# Patient Record
Sex: Female | Born: 1975 | Race: White | Hispanic: No | Marital: Single | Smoking: Current every day smoker
Health system: Southern US, Community
[De-identification: ages and names within clinical notes are randomized; demographics above are authoritative.]

## PROBLEM LIST (undated history)

## (undated) DIAGNOSIS — C801 Malignant (primary) neoplasm, unspecified: Secondary | ICD-10-CM

## (undated) DIAGNOSIS — G43909 Migraine, unspecified, not intractable, without status migrainosus: Secondary | ICD-10-CM

## (undated) DIAGNOSIS — B1910 Unspecified viral hepatitis B without hepatic coma: Secondary | ICD-10-CM

## (undated) DIAGNOSIS — J45909 Unspecified asthma, uncomplicated: Secondary | ICD-10-CM

## (undated) DIAGNOSIS — R569 Unspecified convulsions: Secondary | ICD-10-CM

## (undated) HISTORY — DX: Unspecified convulsions: R56.9

## (undated) HISTORY — DX: Unspecified viral hepatitis B without hepatic coma: B19.10

## (undated) HISTORY — PX: TUBAL LIGATION: SHX77

## (undated) HISTORY — DX: Malignant (primary) neoplasm, unspecified: C80.1

## (undated) HISTORY — PX: CHOLECYSTECTOMY: SHX55

## (undated) HISTORY — PX: DILATION AND CURETTAGE OF UTERUS: SHX78

## (undated) HISTORY — DX: Migraine, unspecified, not intractable, without status migrainosus: G43.909

## (undated) HISTORY — PX: HERNIA REPAIR: SHX51

---

## 2001-05-04 ENCOUNTER — Emergency Department (HOSPITAL_COMMUNITY): Admission: EM | Admit: 2001-05-04 | Discharge: 2001-05-04 | Payer: Self-pay | Admitting: Emergency Medicine

## 2001-05-04 ENCOUNTER — Encounter: Payer: Self-pay | Admitting: Emergency Medicine

## 2003-12-18 ENCOUNTER — Inpatient Hospital Stay: Payer: Self-pay | Admitting: Psychiatry

## 2004-03-18 ENCOUNTER — Emergency Department: Payer: Self-pay | Admitting: Emergency Medicine

## 2005-09-30 ENCOUNTER — Emergency Department: Payer: Self-pay | Admitting: Unknown Physician Specialty

## 2007-01-09 ENCOUNTER — Emergency Department: Payer: Self-pay | Admitting: Emergency Medicine

## 2007-06-10 ENCOUNTER — Emergency Department: Payer: Self-pay | Admitting: Internal Medicine

## 2007-12-31 ENCOUNTER — Emergency Department: Payer: Self-pay | Admitting: Emergency Medicine

## 2008-06-30 ENCOUNTER — Emergency Department: Payer: Self-pay | Admitting: Emergency Medicine

## 2008-07-14 ENCOUNTER — Emergency Department: Payer: Self-pay | Admitting: Emergency Medicine

## 2008-10-03 ENCOUNTER — Inpatient Hospital Stay: Payer: Self-pay | Admitting: Specialist

## 2008-11-17 ENCOUNTER — Inpatient Hospital Stay: Payer: Self-pay | Admitting: Internal Medicine

## 2009-01-17 ENCOUNTER — Emergency Department: Payer: Self-pay | Admitting: Emergency Medicine

## 2009-02-23 ENCOUNTER — Emergency Department: Payer: Self-pay | Admitting: Emergency Medicine

## 2009-04-22 ENCOUNTER — Emergency Department: Payer: Self-pay | Admitting: Emergency Medicine

## 2009-05-23 ENCOUNTER — Emergency Department: Payer: Self-pay

## 2009-10-11 ENCOUNTER — Emergency Department: Payer: Self-pay | Admitting: Internal Medicine

## 2010-01-25 ENCOUNTER — Emergency Department: Payer: Self-pay | Admitting: Unknown Physician Specialty

## 2010-05-18 ENCOUNTER — Emergency Department: Payer: Self-pay | Admitting: Emergency Medicine

## 2011-07-27 ENCOUNTER — Emergency Department: Payer: Self-pay | Admitting: Emergency Medicine

## 2011-07-27 LAB — URINALYSIS, COMPLETE
Bacteria: NONE SEEN
Bilirubin,UR: NEGATIVE
Blood: NEGATIVE
Glucose,UR: NEGATIVE mg/dL (ref 0–75)
Ketone: NEGATIVE
Leukocyte Esterase: NEGATIVE
Nitrite: NEGATIVE
Ph: 5 (ref 4.5–8.0)
Protein: 30
RBC,UR: NONE SEEN /HPF (ref 0–5)
Specific Gravity: 1.027 (ref 1.003–1.030)
Squamous Epithelial: 13
WBC UR: NONE SEEN /HPF (ref 0–5)

## 2011-07-27 LAB — COMPREHENSIVE METABOLIC PANEL
Calcium, Total: 9.3 mg/dL (ref 8.5–10.1)
Chloride: 103 mmol/L (ref 98–107)
Co2: 28 mmol/L (ref 21–32)
Creatinine: 0.95 mg/dL (ref 0.60–1.30)
Glucose: 98 mg/dL (ref 65–99)
Osmolality: 279 (ref 275–301)
SGOT(AST): 23 U/L (ref 15–37)
SGPT (ALT): 13 U/L
Sodium: 140 mmol/L (ref 136–145)
Total Protein: 8.1 g/dL (ref 6.4–8.2)

## 2011-07-27 LAB — CBC
HCT: 38.4 % (ref 35.0–47.0)
HGB: 12.8 g/dL (ref 12.0–16.0)
MCH: 26.4 pg (ref 26.0–34.0)
MCV: 79 fL — ABNORMAL LOW (ref 80–100)
Platelet: 311 10*3/uL (ref 150–440)
RBC: 4.86 10*6/uL (ref 3.80–5.20)
RDW: 18.6 % — ABNORMAL HIGH (ref 11.5–14.5)
WBC: 11.3 10*3/uL — ABNORMAL HIGH (ref 3.6–11.0)

## 2011-07-27 LAB — LIPASE, BLOOD: Lipase: 68 U/L — ABNORMAL LOW (ref 73–393)

## 2011-09-12 ENCOUNTER — Emergency Department: Payer: Self-pay | Admitting: Emergency Medicine

## 2011-09-12 LAB — DRUG SCREEN, URINE
Amphetamines, Ur Screen: NEGATIVE (ref ?–1000)
Barbiturates, Ur Screen: NEGATIVE (ref ?–200)
Benzodiazepine, Ur Scrn: NEGATIVE (ref ?–200)
Cannabinoid 50 Ng, Ur ~~LOC~~: POSITIVE (ref ?–50)
Cocaine Metabolite,Ur ~~LOC~~: POSITIVE (ref ?–300)
MDMA (Ecstasy)Ur Screen: NEGATIVE (ref ?–500)
Methadone, Ur Screen: NEGATIVE (ref ?–300)
Opiate, Ur Screen: NEGATIVE (ref ?–300)
Phencyclidine (PCP) Ur S: NEGATIVE (ref ?–25)
Tricyclic, Ur Screen: NEGATIVE (ref ?–1000)

## 2011-09-12 LAB — CBC
HCT: 36.3 % (ref 35.0–47.0)
HGB: 11.6 g/dL — ABNORMAL LOW (ref 12.0–16.0)
MCH: 25.4 pg — ABNORMAL LOW (ref 26.0–34.0)
MCHC: 32 g/dL (ref 32.0–36.0)
MCV: 80 fL (ref 80–100)
Platelet: 219 10*3/uL (ref 150–440)
RBC: 4.57 10*6/uL (ref 3.80–5.20)
RDW: 17.7 % — ABNORMAL HIGH (ref 11.5–14.5)
WBC: 17.7 10*3/uL — ABNORMAL HIGH (ref 3.6–11.0)

## 2011-09-12 LAB — URINALYSIS, COMPLETE
Bilirubin,UR: NEGATIVE
Glucose,UR: NEGATIVE mg/dL (ref 0–75)
Hyaline Cast: 11
Leukocyte Esterase: NEGATIVE
Nitrite: NEGATIVE
Ph: 5 (ref 4.5–8.0)
Protein: NEGATIVE
RBC,UR: 11 /HPF (ref 0–5)
Specific Gravity: 1.028 (ref 1.003–1.030)
Squamous Epithelial: 5
WBC UR: 1 /HPF (ref 0–5)

## 2011-09-12 LAB — COMPREHENSIVE METABOLIC PANEL
Albumin: 4.6 g/dL (ref 3.4–5.0)
Alkaline Phosphatase: 64 U/L (ref 50–136)
Anion Gap: 10 (ref 7–16)
BUN: 27 mg/dL — ABNORMAL HIGH (ref 7–18)
Bilirubin,Total: 0.4 mg/dL (ref 0.2–1.0)
Calcium, Total: 9 mg/dL (ref 8.5–10.1)
Chloride: 106 mmol/L (ref 98–107)
Co2: 26 mmol/L (ref 21–32)
Creatinine: 0.95 mg/dL (ref 0.60–1.30)
EGFR (African American): >60
EGFR (Non-African Amer.): >60
Glucose: 94 mg/dL (ref 65–99)
Osmolality: 288 (ref 275–301)
Potassium: 3.5 mmol/L (ref 3.5–5.1)
SGOT(AST): 31 U/L (ref 15–37)
SGPT (ALT): 26 U/L
Sodium: 142 mmol/L (ref 136–145)
Total Protein: 7.5 g/dL (ref 6.4–8.2)

## 2011-09-12 LAB — ACETAMINOPHEN LEVEL: Acetaminophen: <2 ug/mL

## 2011-09-12 LAB — PREGNANCY, URINE: Pregnancy Test, Urine: NEGATIVE m[IU]/mL

## 2011-09-12 LAB — ETHANOL
Ethanol %: <0.003 % (ref 0.000–0.080)
Ethanol: <3 mg/dL

## 2011-09-12 LAB — SALICYLATE LEVEL: Salicylates, Serum: 1.8 mg/dL

## 2012-07-31 ENCOUNTER — Emergency Department: Payer: Self-pay | Admitting: Emergency Medicine

## 2012-07-31 LAB — URINALYSIS, COMPLETE
Bilirubin,UR: NEGATIVE
Glucose,UR: NEGATIVE mg/dL (ref 0–75)
Ketone: NEGATIVE
Nitrite: NEGATIVE
Ph: 5 (ref 4.5–8.0)
Protein: NEGATIVE
Specific Gravity: 1.019 (ref 1.003–1.030)
Squamous Epithelial: 9

## 2013-10-30 ENCOUNTER — Emergency Department: Payer: Self-pay | Admitting: Emergency Medicine

## 2013-10-30 LAB — CBC WITH DIFFERENTIAL/PLATELET
Basophil #: 0.1 10*3/uL (ref 0.0–0.1)
Basophil %: 1.2 %
EOS ABS: 0.1 10*3/uL (ref 0.0–0.7)
Eosinophil %: 0.8 %
HCT: 38.5 % (ref 35.0–47.0)
HGB: 12 g/dL (ref 12.0–16.0)
Lymphocyte #: 2.1 10*3/uL (ref 1.0–3.6)
Lymphocyte %: 21.2 %
MCH: 25.5 pg — ABNORMAL LOW (ref 26.0–34.0)
MCHC: 31.3 g/dL — AB (ref 32.0–36.0)
MCV: 81 fL (ref 80–100)
Monocyte #: 0.7 x10 3/mm (ref 0.2–0.9)
Monocyte %: 7 %
NEUTROS PCT: 69.8 %
Neutrophil #: 6.7 10*3/uL — ABNORMAL HIGH (ref 1.4–6.5)
PLATELETS: 314 10*3/uL (ref 150–440)
RBC: 4.72 10*6/uL (ref 3.80–5.20)
RDW: 18.7 % — ABNORMAL HIGH (ref 11.5–14.5)
WBC: 9.7 10*3/uL (ref 3.6–11.0)

## 2013-10-30 LAB — COMPREHENSIVE METABOLIC PANEL
AST: 30 U/L (ref 15–37)
Albumin: 4 g/dL (ref 3.4–5.0)
Alkaline Phosphatase: 60 U/L
Anion Gap: 6 — ABNORMAL LOW (ref 7–16)
BILIRUBIN TOTAL: 0.4 mg/dL (ref 0.2–1.0)
BUN: 12 mg/dL (ref 7–18)
CHLORIDE: 105 mmol/L (ref 98–107)
CREATININE: 0.96 mg/dL (ref 0.60–1.30)
Calcium, Total: 8.5 mg/dL (ref 8.5–10.1)
Co2: 27 mmol/L (ref 21–32)
Glucose: 98 mg/dL (ref 65–99)
Osmolality: 275 (ref 275–301)
POTASSIUM: 3.6 mmol/L (ref 3.5–5.1)
SGPT (ALT): 19 U/L
SODIUM: 138 mmol/L (ref 136–145)
Total Protein: 7.2 g/dL (ref 6.4–8.2)

## 2013-10-30 LAB — URINALYSIS, COMPLETE
BACTERIA: NONE SEEN
BILIRUBIN, UR: NEGATIVE
BLOOD: NEGATIVE
Glucose,UR: NEGATIVE mg/dL (ref 0–75)
Ketone: NEGATIVE
Leukocyte Esterase: NEGATIVE
Nitrite: NEGATIVE
Ph: 6 (ref 4.5–8.0)
Protein: 30
Specific Gravity: 1.023 (ref 1.003–1.030)
Squamous Epithelial: 27

## 2013-10-30 LAB — LIPASE, BLOOD: LIPASE: 85 U/L (ref 73–393)

## 2013-10-30 LAB — CLOSTRIDIUM DIFFICILE(ARMC)

## 2014-06-14 ENCOUNTER — Emergency Department
Admit: 2014-06-14 | Disposition: A | Discharge status: Home / Self Care | Payer: Self-pay | Admitting: Emergency Medicine

## 2014-07-12 ENCOUNTER — Other Ambulatory Visit: Payer: Self-pay | Admitting: Internal Medicine

## 2014-07-12 ENCOUNTER — Ambulatory Visit: Payer: Self-pay | Admitting: Internal Medicine

## 2014-07-12 DIAGNOSIS — R1013 Epigastric pain: Secondary | ICD-10-CM | POA: Insufficient documentation

## 2014-07-12 DIAGNOSIS — R011 Cardiac murmur, unspecified: Secondary | ICD-10-CM

## 2014-07-12 DIAGNOSIS — Z720 Tobacco use: Secondary | ICD-10-CM | POA: Insufficient documentation

## 2014-07-12 DIAGNOSIS — R079 Chest pain, unspecified: Secondary | ICD-10-CM

## 2014-07-12 LAB — CBC AND DIFFERENTIAL
Neutrophils Absolute: 6 /uL
WBC: 8.1 10^3/mL

## 2014-07-12 LAB — LIPID PANEL
CHOLESTEROL: 178 mg/dL (ref 0–200)
HDL: 100 mg/dL — AB (ref 35–70)
LDL Cholesterol: 51 mg/dL
TRIGLYCERIDES: 134 mg/dL (ref 40–160)

## 2014-07-12 LAB — BASIC METABOLIC PANEL
BUN: 13 mg/dL (ref 4–21)
CREATININE: 0.8 mg/dL (ref 0.5–1.1)
Glucose: 95 mg/dL
SODIUM: 138 mmol/L (ref 137–147)

## 2014-07-12 LAB — HEPATIC FUNCTION PANEL: Bilirubin, Total: 0.3 mg/dL

## 2014-07-12 LAB — TSH: TSH: 1.23 u[IU]/mL (ref 0.41–5.90)

## 2014-07-14 ENCOUNTER — Ambulatory Visit
Admission: RE | Admit: 2014-07-14 | Discharge: 2014-07-14 | Disposition: A | Discharge status: Home / Self Care | Payer: Self-pay | Source: Ambulatory Visit | Attending: Internal Medicine | Admitting: Internal Medicine

## 2014-07-14 ENCOUNTER — Other Ambulatory Visit: Payer: Self-pay | Admitting: Internal Medicine

## 2014-07-14 DIAGNOSIS — F17200 Nicotine dependence, unspecified, uncomplicated: Secondary | ICD-10-CM | POA: Insufficient documentation

## 2014-07-14 DIAGNOSIS — R079 Chest pain, unspecified: Secondary | ICD-10-CM

## 2014-07-14 DIAGNOSIS — R011 Cardiac murmur, unspecified: Secondary | ICD-10-CM

## 2014-07-14 NOTE — Progress Notes (Signed)
Echocardiogram 2D Echocardiogram has been performed.  [name] 07/14/2014, 12:10 PM

## 2014-07-26 ENCOUNTER — Ambulatory Visit: Payer: Self-pay | Admitting: Internal Medicine

## 2014-07-26 DIAGNOSIS — R011 Cardiac murmur, unspecified: Secondary | ICD-10-CM | POA: Insufficient documentation

## 2014-07-31 ENCOUNTER — Emergency Department
Admission: EM | Admit: 2014-07-31 | Discharge: 2014-07-31 | Disposition: A | Discharge status: Home / Self Care | Payer: Self-pay | Attending: Emergency Medicine | Admitting: Emergency Medicine

## 2014-07-31 ENCOUNTER — Encounter: Payer: Self-pay | Admitting: *Deleted

## 2014-07-31 DIAGNOSIS — Z720 Tobacco use: Secondary | ICD-10-CM | POA: Insufficient documentation

## 2014-07-31 DIAGNOSIS — L259 Unspecified contact dermatitis, unspecified cause: Secondary | ICD-10-CM | POA: Insufficient documentation

## 2014-07-31 MED ORDER — PREDNISONE 10 MG PO TABS: ORAL_TABLET | ORAL | Status: DC

## 2014-07-31 MED ORDER — PREDNISONE 20 MG PO TABS
60.0000 mg | ORAL_TABLET | Freq: Once | ORAL | Status: AC
  Administered 2014-07-31: 60 mg via ORAL

## 2014-07-31 MED ORDER — DIPHENHYDRAMINE HCL 25 MG PO CAPS: 25.0000 mg | ORAL_CAPSULE | Freq: Three times a day (TID) | ORAL | Status: DC | PRN

## 2014-07-31 NOTE — ED Provider Notes (Signed)
[facility] Emergency Department Provider Note  ____________________________________________  Time seen: Approximately 11:05 AM  I have reviewed the triage vital signs and the nursing notes.   HISTORY  Chief Complaint Poison Ivy   HPI Abigail Martinez is a 39 y.o. female presents to the ER for complaints of bilateral arm and neck rash. 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 Patient states that shortly after she had itching to her bilateral arms. Patient states that this morning she woke up in her sleep rubbing her nose.  States rash is itchy.DENIES facial swelling, difficulty swallowing or eating. Reports continues to eat and drink well. Denies any pain. Reports rash is itching.Denies vision changes. States unrelieved with OTC anti itch and calamine lotion    History reviewed. No pertinent past medical history.  There are no active problems to display for this patient.   History reviewed. No pertinent past surgical history.  No current outpatient prescriptions on file.  Allergies Review of patient's allergies indicates no known allergies.  No family history on file.  Social History History  Substance Use Topics  . Smoking status: Current Every Day Smoker  . Smokeless tobacco: Not on file  . Alcohol Use: No    Review of Systems Constitutional: No fever/chills Eyes: No visual changes. ENT: No sore throat. Cardiovascular: Denies chest pain. Respiratory: Denies shortness of breath. Gastrointestinal: No abdominal pain.  No nausea, no vomiting.  No diarrhea.  No constipation. Genitourinary: Negative for dysuria. Musculoskeletal: Negative for back pain. Skin: positive for rash. Neurological: Negative for headaches, focal weakness or numbness.  10-point ROS otherwise  negative.  ____________________________________________   PHYSICAL EXAM:  VITAL SIGNS: ED Triage Vitals  Enc Vitals Group     BP 07/31/14 1059 135/1 mmHg     Pulse Rate 07/31/14 1059 81     Resp 07/31/14 1059 20     Temp 07/31/14 1059 98.7 F (37.1 C)     Temp Source 07/31/14 1059 Oral     SpO2 07/31/14 1059 99 %     Weight 07/31/14 1059 101 lb (45.813 kg)     Height 07/31/14 1059 5\' 1"  (1.549 m)     Head Cir --      Peak Flow --      Pain Score --      Pain Loc --      Pain Edu? --      Excl. in [place]? --    Blood pressure 141/96, pulse 80, temperature 98.4 F (36.9 C), temperature source Oral, resp. rate 20, height 5\' 1"  (1.549 m), weight 101 lb (45.813 kg), last menstrual period 07/17/2014, SpO2 99 %.  Constitutional: Alert and oriented. Well appearing and in no acute distress. Eyes: Conjunctivae are normal. PERRL. EOMI. Head: Atraumatic.no facial swelling Nose: No [employer].at erythema distal nose. Skin intact. No rash. No swelling or drainage. Nares patent. Mouth/Throat: Mucous membranes are moist.  Oropharynx non-erythematous. Neck: No stridor.  No cervical spine tenderness to palpation. Hematological/Lymphatic/Immunilogical: No cervical lymphadenopathy. Cardiovascular: Normal rate, regular rhythm. Grossly normal heart sounds.  Good peripheral circulation. Respiratory: Normal respiratory effort.  No retractions. Lungs CTAB. Gastrointestinal: Soft and nontender. No distention. No abdominal bruits. No CVA tenderness. Musculoskeletal: No lower extremity tenderness nor edema.  No joint effusions. Neurologic:  Normal speech and language. No gross focal neurologic deficits are appreciated. Speech is normal.  No gait instability. Skin:  Skin is warm, dry and intact.  Bilateral forearms and right neck with area of clustered vesicular and pruritic rash. No erythema, no induration, no fluctuance.  Psychiatric: Mood and affect are normal. Speech and behavior are  normal.  _______________________________________   INITIAL IMPRESSION / ASSESSMENT AND PLAN / ED COURSE  Pertinent labs & imaging results that were available during my care of the patient were reviewed by me and considered in my medical decision making (see chart for details).  Well-appearing patient. Presents for the ER for 2 day history of arm and right neck facial rash. Concern for exposure to poison oak or poison ivy unrelieved by over-the-counter medications. Will treat with taper prednisone and Benadryl. Follow up primary care physician. Return to the ER for new or worsening concerns. ____________________________________________   FINAL CLINICAL IMPRESSION(S) / ED DIAGNOSES  Final diagnoses:  Contact dermatitis      Marylene Land, NP 07/31/14 [place], NP 07/31/14 1144  Lavonia Drafts, MD 07/31/14 [phone]

## 2014-07-31 NOTE — Discharge Instructions (Signed)
Contact Dermatitis Contact dermatitis is a rash that happens when something touches the skin. You touched something that irritates your skin, or you have allergies to something you touched. HOME CARE   Avoid the thing that caused your rash.  Keep your rash away from hot water, soap, sunlight, chemicals, and other things that might bother it.  Do not scratch your rash.  You can take cool baths to help stop itching.  Only take medicine as told by your doctor.  Keep all doctor visits as told. GET HELP RIGHT AWAY IF:   Your rash is not better after 3 days.  Your rash gets worse.  Your rash is puffy (swollen), tender, red, sore, or warm.  You have problems with your medicine. MAKE SURE YOU:   Understand these instructions.  Will watch your condition.  Will get help right away if you are not doing well or get worse. Document Released: 12/08/2008 Document Revised: 05/05/2011 Document Reviewed: 07/16/2010 [facility] Patient Information 2015 [place], [place]. This information is not intended to replace advice given to you by your health care provider. Make sure you discuss any questions you have with your health care provider.  Poison [employer] ivy is a inflammation of the skin (contact dermatitis) caused by touching the allergens on the leaves of the ivy plant following previous exposure to the plant. The rash usually appears 48 hours after exposure. The rash is usually bumps (papules) or blisters (vesicles) in a linear pattern. Depending on your own sensitivity, the rash may simply cause redness and itching, or it may also progress to blisters which may break open. These must be well cared for to prevent secondary bacterial (germ) infection, followed by scarring. Keep any open areas dry, clean, dressed, and covered with an antibacterial ointment if needed. The eyes may also get puffy. The puffiness is worst in the morning and gets better as the day progresses. This dermatitis usually heals  without scarring, within 2 to 3 weeks without treatment. HOME CARE INSTRUCTIONS  Thoroughly wash with soap and water as soon as you have been exposed to poison ivy. You have about one half hour to remove the plant resin before it will cause the rash. This washing will destroy the oil or antigen on the skin that is causing, or will cause, the rash. Be sure to wash under your fingernails as any plant resin there will continue to spread the rash. Do not rub skin vigorously when washing affected area. Poison ivy cannot spread if no oil from the plant remains on your body. A rash that has progressed to weeping sores will not spread the rash unless you have not washed thoroughly. It is also important to wash any clothes you have been wearing as these may carry active allergens. The rash will return if you wear the unwashed clothing, even several days later. Avoidance of the plant in the future is the best measure. Poison ivy plant can be recognized by the number of leaves. Generally, poison ivy has three leaves with flowering branches on a single stem. Diphenhydramine may be purchased over the counter and used as needed for itching. Do not drive with this medication if it makes you drowsy.Ask your caregiver about medication for children. SEEK MEDICAL CARE IF:  Open sores develop.  Redness spreads beyond area of rash.  You notice purulent (pus-like) discharge.  You have increased pain.  Other signs of infection develop (such as fever). Document Released: 02/08/2000 Document Revised: 05/05/2011 Document Reviewed: 07/21/2008 [employer] Patient Information  2015 [employer]. This information is not intended to replace advice given to you by your health care provider. Make sure you discuss any questions you have with your health care provider.

## 2014-10-15 ENCOUNTER — Encounter: Payer: Self-pay | Admitting: Emergency Medicine

## 2014-10-15 ENCOUNTER — Emergency Department
Admission: EM | Admit: 2014-10-15 | Discharge: 2014-10-15 | Disposition: A | Discharge status: Home / Self Care | Payer: Self-pay | Attending: Emergency Medicine | Admitting: Emergency Medicine

## 2014-10-15 DIAGNOSIS — Y998 Other external cause status: Secondary | ICD-10-CM | POA: Insufficient documentation

## 2014-10-15 DIAGNOSIS — Z720 Tobacco use: Secondary | ICD-10-CM | POA: Insufficient documentation

## 2014-10-15 DIAGNOSIS — W57XXXA Bitten or stung by nonvenomous insect and other nonvenomous arthropods, initial encounter: Secondary | ICD-10-CM | POA: Insufficient documentation

## 2014-10-15 DIAGNOSIS — Y9289 Other specified places as the place of occurrence of the external cause: Secondary | ICD-10-CM | POA: Insufficient documentation

## 2014-10-15 DIAGNOSIS — S80262A Insect bite (nonvenomous), left knee, initial encounter: Secondary | ICD-10-CM | POA: Insufficient documentation

## 2014-10-15 DIAGNOSIS — Y9389 Activity, other specified: Secondary | ICD-10-CM | POA: Insufficient documentation

## 2014-10-15 NOTE — Discharge Instructions (Signed)
Spider Bite Most spider bites do not cause serious problems. HOME CARE  Do not scratch the bite.  Keep the bite clean and dry. Wash the bite with soap and water as told by your doctor.  Put ice on the bite.  Put ice in a plastic bag.  Place a towel between your skin and the bag.  Leave the ice on for 20 minutes. Do this 4 times a day for the first 2 to 3 days or as told by your doctor.  Raise (elevate) the bite above your heart.  Only take medicine as told by your doctor.  If you are given medicines (antibiotics), take them as told. Finish them even if you start to feel better. You may need a tetanus shot if:  You cannot remember when you had your last tetanus shot.  You have never had a tetanus shot.  The bite broke your skin. If you need a tetanus shot and you choose not to have one, you may get tetanus. Sickness from tetanus can be serious. GET HELP RIGHT AWAY IF:  Your bite turns purple.  Your bite gets more puffy (swollen), painful, or red.  You are short of breath or have chest pain.  You have muscle cramps or painful muscle spasms.  You have belly (abdominal) pain.  You feel sick to your stomach (nauseous) or throw up (vomit).  You feel very tired or sleepy.  Your bite is not better after 3 days of treatment. MAKE SURE YOU:  Understand these instructions.  Will watch your condition.  Will get help right away if you are not doing well or get worse. Document Released: 03/15/2010 Document Revised: 05/05/2011 Document Reviewed: 09/11/2010 [facility] Patient Information 2015 [place], [place]. This information is not intended to replace advice given to you by your health care provider. Make sure you discuss any questions you have with your health care provider.  Keep the wound clean, dry, and covered.  Apply hydrocortisone cream for itch and irritation.

## 2014-10-15 NOTE — ED Provider Notes (Signed)
[facility] Emergency Department Provider Note ____________________________________________  Time seen: 1530  I have reviewed the triage vital signs and the nursing notes.  HISTORY  Chief Complaint  Insect Bite  HPI Abigail Martinez is a 39 y.o. female reports to the ED for evaluation and management of a suspect a spider bite to the back of the left knee yesterday.She reports the bite occurred while she was in the bed and she felt something bite her from under the sheets. She later believes she found the dead spider in the sheets. She reports some itching and irritation to the small bite, but denies any drainage, fevers, chills, or sweats.  History reviewed. No pertinent past medical history.  There are no active problems to display for this patient.  Past Surgical History  Procedure Laterality Date  . Cholecystectomy    . Hernia repair    . Tubal ligation      Current Outpatient Rx  Name  Route  Sig  Dispense  Refill  . diphenhydrAMINE (BENADRYL) 25 mg capsule   Oral   Take 1 capsule (25 mg total) by mouth every 8 (eight) hours as needed.   15 capsule   0   . predniSONE (DELTASONE) 10 MG tablet      Start 60 mg po day one, then 50 mg po day two, taper by 10 mg daily until complete.   21 tablet   0    Allergies Benadryl and Strawberry  No family history on file.  Social History Social History  Substance Use Topics  . Smoking status: Current Every Day Smoker    Types: Cigarettes  . Smokeless tobacco: Never Used  . Alcohol Use: 1.2 oz/week    2 Cans of beer per week   Review of Systems  Constitutional: Negative for fever. Eyes: Negative for visual changes. ENT: Negative for sore throat. Cardiovascular: Negative for chest pain. Respiratory: Negative for shortness of breath. Gastrointestinal: Negative for abdominal pain, vomiting and diarrhea. Genitourinary: Negative for dysuria. Musculoskeletal: Negative for back pain. Skin: Negative  for rash. Insect bite as above Neurological: Negative for headaches, focal weakness or numbness. ____________________________________________  PHYSICAL EXAM:  VITAL SIGNS: ED Triage Vitals  Enc Vitals Group     BP 10/15/14 1358 130/86 mmHg     Pulse Rate 10/15/14 1358 93     Resp 10/15/14 1358 18     Temp 10/15/14 1358 98 F (36.7 C)     Temp Source 10/15/14 1358 Oral     SpO2 10/15/14 1358 97 %     Weight 10/15/14 1358 114 lb (51.71 kg)     Height 10/15/14 1358 5\' 1"  (1.549 m)     Head Cir --      Peak Flow --      Pain Score 10/15/14 1400 6     Pain Loc --      Pain Edu? --      Excl. in [place]? --    Constitutional: Alert and oriented. Well appearing and in no distress. Eyes: Conjunctivae are normal. PERRL. Normal extraocular movements. ENT   Head: Normocephalic and atraumatic.   Nose: No congestion/rhinnorhea.   Mouth/Throat: Mucous membranes are moist.   Neck: Supple. No thyromegaly. Hematological/Lymphatic/Immunilogical: No cervical lymphadenopathy. Cardiovascular: Normal distal pulses Respiratory: Normal respiratory effort.  Musculoskeletal: Nontender with normal range of motion in all extremities.  Neurologic:  Normal gait without ataxia. Normal speech and language. No gross focal neurologic deficits are appreciated. Skin:  Skin is warm, dry  and intact. No rash noted. Local area of erythema and induration to the back of the left knee. No fluctuance, warmth, lymphangitis, or drainage. Psychiatric: Mood and affect are normal. Patient exhibits appropriate insight and judgment. ____________________________________________  PROCEDURES  Dry dressing applied ____________________________________________  INITIAL IMPRESSION / ASSESSMENT AND PLAN / ED COURSE  Local reaction to spider bite. No signs of infection, cellulitis, or abscess. ____________________________________________  FINAL CLINICAL IMPRESSION(S) / ED DIAGNOSES  Final diagnoses:  Insect bite      Melvenia Needles, PA-C 10/15/14 1543  Lisa Roca, MD 10/18/14 1210

## 2014-10-15 NOTE — ED Notes (Signed)
AAOx3.  Skin warm and dry.  NAD.  D/C home 

## 2014-10-15 NOTE — ED Notes (Signed)
AAOx3.  Skin warm and dry.  NAD.  Ambulates with easy and steady gait.  D/C home 

## 2014-10-15 NOTE — ED Notes (Addendum)
Pt reports spider bite behind left knee yesterday Red swollen area noted. Pt reports feeling light headed and nauseated. Spider with pt in Pill bottle.

## 2014-12-19 ENCOUNTER — Emergency Department
Admission: EM | Admit: 2014-12-19 | Discharge: 2014-12-19 | Disposition: A | Discharge status: Home / Self Care | Payer: Self-pay | Attending: Student | Admitting: Student

## 2014-12-19 ENCOUNTER — Encounter: Payer: Self-pay | Admitting: Emergency Medicine

## 2014-12-19 DIAGNOSIS — H10023 Other mucopurulent conjunctivitis, bilateral: Secondary | ICD-10-CM

## 2014-12-19 DIAGNOSIS — Z7952 Long term (current) use of systemic steroids: Secondary | ICD-10-CM | POA: Insufficient documentation

## 2014-12-19 DIAGNOSIS — H109 Unspecified conjunctivitis: Secondary | ICD-10-CM | POA: Insufficient documentation

## 2014-12-19 DIAGNOSIS — R51 Headache: Secondary | ICD-10-CM | POA: Insufficient documentation

## 2014-12-19 DIAGNOSIS — Z720 Tobacco use: Secondary | ICD-10-CM | POA: Insufficient documentation

## 2014-12-19 HISTORY — DX: Unspecified asthma, uncomplicated: J45.909

## 2014-12-19 MED ORDER — TRAMADOL HCL 50 MG PO TABS
50.0000 mg | ORAL_TABLET | Freq: Four times a day (QID) | ORAL | Status: DC | PRN
Start: 2014-12-19 — End: 2016-06-29

## 2014-12-19 MED ORDER — GENTAMICIN SULFATE 0.3 % OP SOLN: 1.0000 [drp] | Freq: Four times a day (QID) | OPHTHALMIC | Status: DC

## 2014-12-19 NOTE — ED Notes (Signed)
Pt complains of bilateral eye pain with facial pain. Pus pocket is noted in right lower lid. Pt denies any blurred vision.

## 2014-12-19 NOTE — Discharge Instructions (Signed)
Bacterial Conjunctivitis °Bacterial conjunctivitis, commonly called pink eye, is an inflammation of the clear membrane that covers the white part of the eye (conjunctiva). The inflammation can also happen on the underside of the eyelids. The blood vessels in the conjunctiva become inflamed, causing the eye to become red or pink. Bacterial conjunctivitis may spread easily from one eye to another and from person to person (contagious).  °CAUSES  °Bacterial conjunctivitis is caused by bacteria. The bacteria may come from your own skin, your upper respiratory tract, or from someone else with bacterial conjunctivitis. °SYMPTOMS  °The normally white color of the eye or the underside of the eyelid is usually pink or red. The pink eye is usually associated with irritation, tearing, and some sensitivity to light. Bacterial conjunctivitis is often associated with a thick, yellowish discharge from the eye. The discharge may turn into a crust on the eyelids overnight, which causes your eyelids to stick together. If a discharge is present, there may also be some blurred vision in the affected eye. °DIAGNOSIS  °Bacterial conjunctivitis is diagnosed by your caregiver through an eye exam and the symptoms that you report. Your caregiver looks for changes in the surface tissues of your eyes, which may point to the specific type of conjunctivitis. A sample of any discharge may be collected on a cotton-tip swab if you have a severe case of conjunctivitis, if your cornea is affected, or if you keep getting repeat infections that do not respond to treatment. The sample will be sent to a lab to see if the inflammation is caused by a bacterial infection and to see if the infection will respond to antibiotic medicines. °TREATMENT  °1. Bacterial conjunctivitis is treated with antibiotics. Antibiotic eyedrops are most often used. However, antibiotic ointments are also available. Antibiotics pills are sometimes used. Artificial tears or eye  washes may ease discomfort. °HOME CARE INSTRUCTIONS  °1. To ease discomfort, apply a cool, clean washcloth to your eye for 10-20 minutes, 3-4 times a day. °2. Gently wipe away any drainage from your eye with a warm, wet washcloth or a cotton ball. °3. Wash your hands often with soap and water. Use paper towels to dry your hands. °4. Do not share towels or washcloths. This may spread the infection. °5. Change or wash your pillowcase every day. °6. You should not use eye makeup until the infection is gone. °7. Do not operate machinery or drive if your vision is blurred. °8. Stop using contact lenses. Ask your caregiver how to sterilize or replace your contacts before using them again. This depends on the type of contact lenses that you use. °9. When applying medicine to the infected eye, do not touch the edge of your eyelid with the eyedrop bottle or ointment tube. °SEEK IMMEDIATE MEDICAL CARE IF:  °· Your infection has not improved within 3 days after beginning treatment. °· You had yellow discharge from your eye and it returns. °· You have increased eye pain. °· Your eye redness is spreading. °· Your vision becomes blurred. °· You have a fever or persistent symptoms for more than 2-3 days. °· You have a fever and your symptoms suddenly get worse. °· You have facial pain, redness, or swelling. °MAKE SURE YOU:  °· Understand these instructions. °· Will watch your condition. °· Will get help right away if you are not doing well or get worse. °  °This information is not intended to replace advice given to you by your health care provider. Make sure you   discuss any questions you have with your health care provider. °  °Document Released: 02/10/2005 Document Revised: 03/03/2014 Document Reviewed: 07/14/2011 °Elsevier Interactive Patient Education ©2016 [employer] ° °How to Use Eye Drops and Eye Ointments °HOW TO APPLY EYE DROPS °Follow these steps when applying eye drops: °2. Wash your hands. °3. Tilt your head  back. °4. Put a finger under your eye and use it to gently pull your lower lid downward. Keep that finger in place. °5. Using your other hand, hold the dropper between your thumb and index finger. °6. Position the dropper just over the edge of the lower lid. Hold it as close to your eye as you can without touching the dropper to your eye. °7. Steady your hand. One way to do this is to lean your index finger against your brow. °8. Look up. °9. Slowly and gently squeeze one drop of medicine into your eye. °10. Close your eye. °11. Place a finger between your lower eyelid and your nose. Press gently for 2 minutes. This increases the amount of time that the medicine is exposed to the eye. It also reduces side effects that can develop if the drop gets into the bloodstream through the nose. °HOW TO APPLY EYE OINTMENTS °Follow these steps when applying eye ointments: °10. Wash your hands. °11. Put a finger under your eye and use it to gently pull your lower lid downward. Keep that finger in place. °12. Using your other hand, place the tip of the tube between your thumb and index finger with the remaining fingers braced against your cheek or nose. °13. Hold the tube just over the edge of your lower lid without touching the tube to your lid or eyeball. °14. Look up. °15. Line the inner part of your lower lid with ointment. °16. Gently pull up on your upper lid and look down. This will force the ointment to spread over the surface of the eye. °17. Release the upper lid. °18. If you can, close your eyes for 1-2 minutes. °Do not rub your eyes. If you applied the ointment correctly, your vision will be blurry for a few minutes. This is normal. °ADDITIONAL INFORMATION °· Make sure to use the eye drops or ointment as told by your health care provider. °· If you have been told to use both eye drops and an eye ointment, apply the eye drops first, then wait 3-4 minutes before you apply the ointment. °· Try not to touch the tip of the  dropper or tube to your eye. A dropper or tube that has touched the eye can become contaminated. °  °This information is not intended to replace advice given to you by your health care provider. Make sure you discuss any questions you have with your health care provider. °  °Document Released: 05/19/2000 Document Revised: 06/27/2014 Document Reviewed: 02/06/2014 °Elsevier Interactive Patient Education ©2016 [employer] ° °

## 2014-12-19 NOTE — ED Provider Notes (Signed)
[facility] Emergency Department Provider Note  ____________________________________________  Time seen: Approximately 12:36 PM  I have reviewed the triage vital signs and the nursing notes.   HISTORY  Chief Complaint Eye Pain    HPI Abigail Martinez is a 39 y.o. female patient complaining of bilateral eye pain restraints. Patient states left teye was initially affected. Patient denies contact usage.Patient denies any URI signs symptoms. Patient also denies any fever. Patient states she has a headache associated with this complaint. Patient rates her overall pain discomfort as a 6/10. No palliative measures taken for this complaint.   Past Medical History  Diagnosis Date  . Asthma     There are no active problems to display for this patient.   Past Surgical History  Procedure Laterality Date  . Cholecystectomy    . Hernia repair    . Tubal ligation      Current Outpatient Rx  Name  Route  Sig  Dispense  Refill  . diphenhydrAMINE (BENADRYL) 25 mg capsule   Oral   Take 1 capsule (25 mg total) by mouth every 8 (eight) hours as needed.   15 capsule   0   . predniSONE (DELTASONE) 10 MG tablet      Start 60 mg po day one, then 50 mg po day two, taper by 10 mg daily until complete.   21 tablet   0     Allergies Strawberry extract  No family history on file.  Social History Social History  Substance Use Topics  . Smoking status: Current Every Day Smoker    Types: Cigarettes  . Smokeless tobacco: Never Used  . Alcohol Use: 1.2 oz/week    2 Cans of beer per week    Review of Systems Constitutional: No fever/chills Eyes: No visual changes. CBC currently results ENT: No sore throat. Cardiovascular: Denies chest pain. Respiratory: Denies shortness of breath. Gastrointestinal: No abdominal pain.  No nausea, no vomiting.  No diarrhea.  No constipation. Genitourinary: Negative for dysuria. Musculoskeletal: Negative for back pain. Skin:  Negative for rash. Neurological: Negative for headaches, focal weakness or numbness. Allergic/Immunilogical: Strawberry abstract  10-point ROS otherwise negative.  ____________________________________________   PHYSICAL EXAM:  VITAL SIGNS: ED Triage Vitals  Enc Vitals Group     BP 12/19/14 1157 151/96 mmHg     Pulse Rate 12/19/14 1157 83     Resp 12/19/14 1157 16     Temp 12/19/14 1157 98.1 F (36.7 C)     Temp Source 12/19/14 1157 Oral     SpO2 12/19/14 1157 98 %     Weight 12/19/14 1157 113 lb (51.256 kg)     Height 12/19/14 1157 5\' 1"  (1.549 m)     Head Cir --      Peak Flow --      Pain Score 12/19/14 1158 6     Pain Loc --      Pain Edu? --      Excl. in [place]? --     Constitutional: Alert and oriented. Well appearing and in no acute distress. Eyes: Conjunctivae are erythematous. PERRL. EOMI. PERRL in bilateral discharge Head: Atraumatic. Nose: No congestion/rhinnorhea. Mouth/Throat: Mucous membranes are moist.  Oropharynx non-erythematous. Neck: No stridor. No cervical spine tenderness to palpation. Hematological/Lymphatic/Immunilogical: No cervical lymphadenopathy. Cardiovascular: Normal rate, regular rhythm. Grossly normal heart sounds.  Good peripheral circulation. Respiratory: Normal respiratory effort.  No retractions. Lungs CTAB. Gastrointestinal: Soft and nontender. No distention. No abdominal bruits. No CVA tenderness. Musculoskeletal: No  lower extremity tenderness nor edema.  No joint effusions. Neurologic:  Normal speech and language. No gross focal neurologic deficits are appreciated. No gait instability. Skin:  Skin is warm, dry and intact. No rash noted. Psychiatric: Mood and affect are normal. Speech and behavior are normal.  ____________________________________________   LABS (all labs ordered are listed, but only abnormal results are displayed)  Labs Reviewed - No data to  display ____________________________________________  EKG   ____________________________________________  RADIOLOGY   ____________________________________________   PROCEDURES  Procedure(s) performed: None  Critical Care performed: No  ____________________________________________   INITIAL IMPRESSION / ASSESSMENT AND PLAN / ED COURSE  Pertinent labs & imaging results that were available during my care of the patient were reviewed by me and considered in my medical decision making (see chart for details).  Bacterial conjunctivitis bilaterally. Patient given prescription for gentamicin eyedrops. Patient given discharge instruction 104 course contamination. Patient advised to [facility] condition [facility]. ____________________________________________   FINAL CLINICAL IMPRESSION(S) / ED DIAGNOSES  Final diagnoses:  Acute bacterial conjunctivitis, bilateral      Abigail MICHELLI, PA-C 12/19/14 [place] Gayle, MD 12/19/14 [phone]

## 2015-01-01 ENCOUNTER — Other Ambulatory Visit: Payer: Self-pay

## 2015-01-01 ENCOUNTER — Encounter: Payer: Self-pay | Admitting: Emergency Medicine

## 2015-01-01 ENCOUNTER — Emergency Department
Admission: EM | Admit: 2015-01-01 | Discharge: 2015-01-01 | Disposition: A | Discharge status: Home / Self Care | Payer: Self-pay | Attending: Emergency Medicine | Admitting: Emergency Medicine

## 2015-01-01 DIAGNOSIS — R197 Diarrhea, unspecified: Secondary | ICD-10-CM | POA: Insufficient documentation

## 2015-01-01 DIAGNOSIS — Z3202 Encounter for pregnancy test, result negative: Secondary | ICD-10-CM | POA: Insufficient documentation

## 2015-01-01 DIAGNOSIS — K529 Noninfective gastroenteritis and colitis, unspecified: Secondary | ICD-10-CM

## 2015-01-01 DIAGNOSIS — Z720 Tobacco use: Secondary | ICD-10-CM | POA: Insufficient documentation

## 2015-01-01 DIAGNOSIS — R1013 Epigastric pain: Secondary | ICD-10-CM | POA: Insufficient documentation

## 2015-01-01 DIAGNOSIS — R140 Abdominal distension (gaseous): Secondary | ICD-10-CM | POA: Insufficient documentation

## 2015-01-01 DIAGNOSIS — Z79899 Other long term (current) drug therapy: Secondary | ICD-10-CM | POA: Insufficient documentation

## 2015-01-01 LAB — CBC
HCT: 40.2 % (ref 35.0–47.0)
HEMOGLOBIN: 13.4 g/dL (ref 12.0–16.0)
MCH: 31.8 pg (ref 26.0–34.0)
MCHC: 33.5 g/dL (ref 32.0–36.0)
MCV: 95.1 fL (ref 80.0–100.0)
PLATELETS: 226 10*3/uL (ref 150–440)
RBC: 4.22 MIL/uL (ref 3.80–5.20)
RDW: 18.3 % — ABNORMAL HIGH (ref 11.5–14.5)
WBC: 8 10*3/uL (ref 3.6–11.0)

## 2015-01-01 LAB — URINALYSIS COMPLETE WITH MICROSCOPIC (ARMC ONLY)
BILIRUBIN URINE: NEGATIVE
Bacteria, UA: NONE SEEN
Glucose, UA: NEGATIVE mg/dL
Hgb urine dipstick: NEGATIVE
KETONES UR: NEGATIVE mg/dL
Leukocytes, UA: NEGATIVE
NITRITE: NEGATIVE
Protein, ur: NEGATIVE mg/dL
SPECIFIC GRAVITY, URINE: 1.019 (ref 1.005–1.030)
WBC UA: NONE SEEN WBC/hpf (ref 0–5)
pH: 7 (ref 5.0–8.0)

## 2015-01-01 LAB — COMPREHENSIVE METABOLIC PANEL
ALK PHOS: 57 U/L (ref 38–126)
ALT: 24 U/L (ref 14–54)
ANION GAP: 8 (ref 5–15)
AST: 54 U/L — ABNORMAL HIGH (ref 15–41)
Albumin: 4.2 g/dL (ref 3.5–5.0)
BILIRUBIN TOTAL: 0.6 mg/dL (ref 0.3–1.2)
BUN: 9 mg/dL (ref 6–20)
CALCIUM: 9.3 mg/dL (ref 8.9–10.3)
CO2: 28 mmol/L (ref 22–32)
CREATININE: 0.66 mg/dL (ref 0.44–1.00)
Chloride: 105 mmol/L (ref 101–111)
GFR calc non Af Amer: >60 mL/min (ref >60)
Glucose, Bld: 115 mg/dL — ABNORMAL HIGH (ref 65–99)
Potassium: 3.9 mmol/L (ref 3.5–5.1)
SODIUM: 141 mmol/L (ref 135–145)
TOTAL PROTEIN: 6.9 g/dL (ref 6.5–8.1)

## 2015-01-01 LAB — POCT PREGNANCY, URINE: Preg Test, Ur: NEGATIVE

## 2015-01-01 LAB — LIPASE, BLOOD: Lipase: 25 U/L (ref 11–51)

## 2015-01-01 MED ORDER — SIMETHICONE 80 MG PO CHEW: 80.0000 mg | CHEWABLE_TABLET | Freq: Four times a day (QID) | ORAL | Status: DC | PRN

## 2015-01-01 MED ORDER — DICYCLOMINE HCL 20 MG PO TABS: 20.0000 mg | ORAL_TABLET | Freq: Four times a day (QID) | ORAL | Status: DC

## 2015-01-01 NOTE — ED Notes (Signed)
Pt to ed with c/o increased bloating over the last 3 weeks.  Pt states bloating is worse after eating.  Pt states "I feel like I am about to pop"

## 2015-01-01 NOTE — ED Notes (Signed)
Pt states abd swelling and bloating for 3 weeks, states at night the swelling gets worse, last BM 11/7, states she feels pressure on her hips and ribs

## 2015-01-01 NOTE — Discharge Instructions (Signed)
Please make an appointment to establish a primary care physician for follow-up. Next  Please return to the emergency department if you develop severe pain, inability to keep down fluids, lightheadedness or fainting, fever, or any other symptoms concerning to you.  Abdominal Pain, Adult Many things can cause abdominal pain. Usually, abdominal pain is not caused by a disease and will improve without treatment. It can often be observed and treated at home. Your health care provider will do a physical exam and possibly order blood tests and X-rays to help determine the seriousness of your pain. However, in many cases, more time must pass before a clear cause of the pain can be found. Before that point, your health care provider may not know if you need more testing or further treatment. HOME CARE INSTRUCTIONS Monitor your abdominal pain for any changes. The following actions may help to alleviate any discomfort you are experiencing:  Only take over-the-counter or prescription medicines as directed by your health care provider.  Do not take laxatives unless directed to do so by your health care provider.  Try a clear liquid diet (broth, tea, or water) as directed by your health care provider. Slowly move to a bland diet as tolerated. SEEK MEDICAL CARE IF:  You have unexplained abdominal pain.  You have abdominal pain associated with nausea or diarrhea.  You have pain when you urinate or have a bowel movement.  You experience abdominal pain that wakes you in the night.  You have abdominal pain that is worsened or improved by eating food.  You have abdominal pain that is worsened with eating fatty foods.  You have a fever. SEEK IMMEDIATE MEDICAL CARE IF:  Your pain does not go away within 2 hours.  You keep throwing up (vomiting).  Your pain is felt only in portions of the abdomen, such as the right side or the left lower portion of the abdomen.  You pass bloody or black tarry  stools. MAKE SURE YOU:  Understand these instructions.  Will watch your condition.  Will get help right away if you are not doing well or get worse.   This information is not intended to replace advice given to you by your health care provider. Make sure you discuss any questions you have with your health care provider.   Document Released: 11/20/2004 Document Revised: 11/01/2014 Document Reviewed: 10/20/2012 Elsevier Interactive Patient Education [employer].

## 2015-01-01 NOTE — ED Provider Notes (Signed)
[facility] Emergency Department Provider Note  ____________________________________________  Time seen: Approximately 3:07 PM  I have reviewed the triage vital signs and the nursing notes.   HISTORY  Chief Complaint Abdominal Pain    HPI Abigail Martinez is a 39 y.o. female s/p cholecystectomy remotely presenting with 3 weeks of abdominal pain and bloating. Patient reports that each day, sometimes for most of the day, she develops abdominal bloating with associated "tight" feeling which is diffuse but worse in the epigastrium. This is worse with eating. It does not change with lying down. She does not have associated burping or an acidic taste in her mouth. It does not wake her up at night. Today she had a normal bowel movement, but does report chronic diarrhea that is unchanged. She denies any fever, nausea, vomiting. She denies any urinary symptoms or change in vaginal discharge. She has not been seen by her primary care physician due to insurance issues. No recent changes in her diet; "I try to eat good."   Past Medical History  Diagnosis Date  . Asthma     There are no active problems to display for this patient.   Past Surgical History  Procedure Laterality Date  . Cholecystectomy    . Hernia repair    . Tubal ligation      Current Outpatient Rx  Name  Route  Sig  Dispense  Refill  . dicyclomine (BENTYL) 20 MG tablet   Oral   Take 1 tablet (20 mg total) by mouth 4 (four) times daily.   28 tablet   0   . diphenhydrAMINE (BENADRYL) 25 mg capsule   Oral   Take 1 capsule (25 mg total) by mouth every 8 (eight) hours as needed.   15 capsule   0   . gentamicin (GARAMYCIN) 0.3 % ophthalmic solution   Both Eyes   Place 1 drop into both eyes 4 (four) times daily.   5 mL   0   . predniSONE (DELTASONE) 10 MG tablet      Start 60 mg po day one, then 50 mg po day two, taper by 10 mg daily until complete.   21 tablet   0   . simethicone (GAS-X)  80 MG chewable tablet   Oral   Chew 1 tablet (80 mg total) by mouth 4 (four) times daily as needed for flatulence.   20 tablet   0   . traMADol (ULTRAM) 50 MG tablet   Oral   Take 1 tablet (50 mg total) by mouth every 6 (six) hours as needed for moderate pain.   12 tablet   0     Allergies Strawberry extract  History reviewed. No pertinent family history.  Social History Social History  Substance Use Topics  . Smoking status: Current Every Day Smoker    Types: Cigarettes  . Smokeless tobacco: Never Used  . Alcohol Use: 1.2 oz/week    2 Cans of beer per week    Review of Systems Constitutional: No fever/chills. No lightheadedness or syncope. Eyes: No visual changes. ENT: No sore throat. Cardiovascular: Denies chest pain, palpitations. Respiratory: Denies shortness of breath.  No cough. Gastrointestinal: Positive epigastric abdominal pain.  No nausea, no vomiting.  Positive chronic diarrhea.  No constipation. Genitourinary: Negative for dysuria. Negative for vaginal discharge. Musculoskeletal: Negative for back pain. Skin: Negative for rash. Neurological: Negative for headaches, focal weakness or numbness.  10-point ROS otherwise negative.  ____________________________________________   PHYSICAL EXAM:  VITAL  SIGNS: ED Triage Vitals  Enc Vitals Group     BP 01/01/15 1143 132/83 mmHg     Pulse Rate 01/01/15 1143 87     Resp 01/01/15 1143 20     Temp 01/01/15 1143 98.3 F (36.8 C)     Temp Source 01/01/15 1143 Oral     SpO2 01/01/15 1143 97 %     Weight 01/01/15 1143 115 lb (52.164 kg)     Height 01/01/15 1143 5\' 1"  (1.549 m)     Head Cir --      Peak Flow --      Pain Score 01/01/15 1143 4     Pain Loc --      Pain Edu? --      Excl. in [place]? --     Constitutional: Alert and oriented. Well appearing and in no acute distress. Answers questions appropriately. Eyes: Conjunctivae are normal.  EOMI. Head: Atraumatic. Nose: No  congestion/rhinnorhea. Mouth/Throat: Mucous membranes are moist.  Neck: No stridor.  Supple.   Cardiovascular: Normal rate, regular rhythm. No murmurs, rubs or gallops.  Respiratory: Normal respiratory effort.  No retractions. Lungs CTAB.  No wheezes, rales or ronchi. Gastrointestinal: Abdomen is soft and mildly distended without fluid wave. She has some mild diffuse tenderness to palpation without focality. She has no Murphy sign. Musculoskeletal: No LE edema.  Neurologic:  Normal speech and language. No gross focal neurologic deficits are appreciated.  Skin:  Skin is warm, dry and intact. No rash noted. Psychiatric: Mood and affect are normal. Speech and behavior are normal.  Normal judgement.  ____________________________________________   LABS (all labs ordered are listed, but only abnormal results are displayed)  Labs Reviewed  COMPREHENSIVE METABOLIC PANEL - Abnormal; Notable for the following:    Glucose, Bld 115 (*)    AST 54 (*)    All other components within normal limits  CBC - Abnormal; Notable for the following:    RDW 18.3 (*)    All other components within normal limits  URINALYSIS COMPLETEWITH MICROSCOPIC ([facility] ONLY) - Abnormal; Notable for the following:    Color, Urine YELLOW (*)    APPearance HAZY (*)    Squamous Epithelial / LPF 6-30 (*)    All other components within normal limits  LIPASE, BLOOD  POCT PREGNANCY, URINE   ____________________________________________  EKG  ED ECG REPORT I, Eula Listen, the attending physician, personally viewed and interpreted this ECG.   Date: 01/01/2015  EKG Time: 1151  Rate: 81  Rhythm: normal sinus rhythm  Axis: Normal  Intervals:none  ST&T Change: Nonspecific T-wave inversion in V1. Incomplete right bundle-branch block. No evidence of ischemia.  ____________________________________________  RADIOLOGY  No results found.  ____________________________________________   PROCEDURES  Procedure(s)  performed: None  Critical Care performed: No ____________________________________________   INITIAL IMPRESSION / ASSESSMENT AND PLAN / ED COURSE  Pertinent labs & imaging results that were available during my care of the patient were reviewed by me and considered in my medical decision making (see chart for details).  39 y.o. female with 3 weeks of abdominal pain and bloating. Her vital signs are stable and on my exam she has a nonfocal exam. Her exam is not consistent with an acute surgical pathology such as appendicitis or obstruction. It is possible that she may have some IBS, or gas. I will start her on simethicone and then tell, and have her follow up with her primary care physician. She understands return precautions and follow-up instructions.  ____________________________________________  FINAL CLINICAL IMPRESSION(S) / ED DIAGNOSES  Final diagnoses:  Epigastric abdominal pain  Abdominal bloating  Chronic diarrhea      NEW MEDICATIONS STARTED DURING THIS VISIT:  New Prescriptions   DICYCLOMINE (BENTYL) 20 MG TABLET    Take 1 tablet (20 mg total) by mouth 4 (four) times daily.   SIMETHICONE (GAS-X) 80 MG CHEWABLE TABLET    Chew 1 tablet (80 mg total) by mouth 4 (four) times daily as needed for flatulence.     Eula Listen, MD 01/01/15 1512

## 2015-02-01 ENCOUNTER — Encounter: Payer: Self-pay | Admitting: Emergency Medicine

## 2015-02-01 ENCOUNTER — Emergency Department
Admission: EM | Admit: 2015-02-01 | Discharge: 2015-02-01 | Disposition: A | Discharge status: Home / Self Care | Payer: Self-pay | Attending: Emergency Medicine | Admitting: Emergency Medicine

## 2015-02-01 DIAGNOSIS — R05 Cough: Secondary | ICD-10-CM

## 2015-02-01 DIAGNOSIS — J45909 Unspecified asthma, uncomplicated: Secondary | ICD-10-CM | POA: Insufficient documentation

## 2015-02-01 DIAGNOSIS — R059 Cough, unspecified: Secondary | ICD-10-CM

## 2015-02-01 DIAGNOSIS — J40 Bronchitis, not specified as acute or chronic: Secondary | ICD-10-CM

## 2015-02-01 DIAGNOSIS — Z79899 Other long term (current) drug therapy: Secondary | ICD-10-CM | POA: Insufficient documentation

## 2015-02-01 DIAGNOSIS — Z792 Long term (current) use of antibiotics: Secondary | ICD-10-CM | POA: Insufficient documentation

## 2015-02-01 DIAGNOSIS — F17210 Nicotine dependence, cigarettes, uncomplicated: Secondary | ICD-10-CM | POA: Insufficient documentation

## 2015-02-01 MED ORDER — PREDNISONE 20 MG PO TABS: ORAL_TABLET | ORAL | Status: DC

## 2015-02-01 MED ORDER — HYDROCOD POLST-CPM POLST ER 10-8 MG/5ML PO SUER: 5.0000 mL | Freq: Two times a day (BID) | ORAL | Status: DC

## 2015-02-01 MED ORDER — HYDROCOD POLST-CPM POLST ER 10-8 MG/5ML PO SUER
5.0000 mL | Freq: Once | ORAL | Status: AC
  Administered 2015-02-01: 5 mL via ORAL
  Filled 2015-02-01: qty 5

## 2015-02-01 MED ORDER — PREDNISONE 20 MG PO TABS
60.0000 mg | ORAL_TABLET | Freq: Once | ORAL | Status: AC
  Administered 2015-02-01: 60 mg via ORAL
  Filled 2015-02-01: qty 3

## 2015-02-01 NOTE — ED Notes (Signed)
Pt dc home ambulatory pain unchanged instructed on follow up plan and med use PT NAD AT DC 

## 2015-02-01 NOTE — ED Provider Notes (Signed)
[facility] Emergency Department Provider Note  ____________________________________________  Time seen: Approximately 4:35 AM  I have reviewed the triage vital signs and the nursing notes.   HISTORY  Chief Complaint Cough    HPI DELVINA Martinez is a 39 y.o. female who presents to the ED from home with a chief complaint of cough. Patient has a history of asthma and notes nonproductive cough 2-3 nights. + Sick contacts. Presents this morning secondary to cough causing her inability to rest. Denies associated fever, chills, chest pain, wheezing, shortness of breath, abdominal pain, nausea, vomiting, diarrhea. Denies recent travel or trauma. Nothing makes her cough better or worse.   Past Medical History  Diagnosis Date  . Asthma     There are no active problems to display for this patient.   Past Surgical History  Procedure Laterality Date  . Cholecystectomy    . Hernia repair    . Tubal ligation      Current Outpatient Rx  Name  Route  Sig  Dispense  Refill  . dicyclomine (BENTYL) 20 MG tablet   Oral   Take 1 tablet (20 mg total) by mouth 4 (four) times daily.   28 tablet   0   . diphenhydrAMINE (BENADRYL) 25 mg capsule   Oral   Take 1 capsule (25 mg total) by mouth every 8 (eight) hours as needed.   15 capsule   0   . gentamicin (GARAMYCIN) 0.3 % ophthalmic solution   Both Eyes   Place 1 drop into both eyes 4 (four) times daily.   5 mL   0   . predniSONE (DELTASONE) 10 MG tablet      Start 60 mg po day one, then 50 mg po day two, taper by 10 mg daily until complete.   21 tablet   0   . simethicone (GAS-X) 80 MG chewable tablet   Oral   Chew 1 tablet (80 mg total) by mouth 4 (four) times daily as needed for flatulence.   20 tablet   0   . traMADol (ULTRAM) 50 MG tablet   Oral   Take 1 tablet (50 mg total) by mouth every 6 (six) hours as needed for moderate pain.   12 tablet   0     Allergies Strawberry extract  No  family history on file.  Social History Social History  Substance Use Topics  . Smoking status: Current Every Day Smoker -- 0.50 packs/day    Types: Cigarettes  . Smokeless tobacco: Never Used  . Alcohol Use: 1.2 oz/week    2 Cans of beer per week    Review of Systems Constitutional: No fever/chills Eyes: No visual changes. ENT: No sore throat. Cardiovascular: Denies chest pain. Respiratory: Positive for nonproductive cough. Denies shortness of breath. Gastrointestinal: No abdominal pain.  No nausea, no vomiting.  No diarrhea.  No constipation. Genitourinary: Negative for dysuria. Musculoskeletal: Negative for back pain. Skin: Negative for rash. Neurological: Negative for headaches, focal weakness or numbness.  10-point ROS otherwise negative.  ____________________________________________   PHYSICAL EXAM:  VITAL SIGNS: ED Triage Vitals  Enc Vitals Group     BP 02/01/15 0332 132/100 mmHg     Pulse Rate 02/01/15 0332 88     Resp 02/01/15 0332 20     Temp 02/01/15 0332 98.2 F (36.8 C)     Temp Source 02/01/15 0332 Oral     SpO2 02/01/15 0332 97 %     Weight 02/01/15 0332 118  lb (53.524 kg)     Height 02/01/15 0332 5\' 1"  (1.549 m)     Head Cir --      Peak Flow --      Pain Score --      Pain Loc --      Pain Edu? --      Excl. in [place]? --     Constitutional: Alert and oriented. Well appearing and in no acute distress. Eyes: Conjunctivae are normal. PERRL. EOMI. Head: Atraumatic. Nose: No congestion/rhinnorhea. Mouth/Throat: Mucous membranes are moist.  Oropharynx non-erythematous. Hoarse voice. Neck: No stridor.   Hematological/Lymphatic/Immunilogical: No cervical lymphadenopathy. Cardiovascular: Normal rate, regular rhythm. Grossly normal heart sounds.  Good peripheral circulation. Respiratory: Normal respiratory effort.  No retractions. Lungs slightly diminished at both bases but otherwise CTAB. Loose, rattling nonproductive cough noted. Gastrointestinal:  Soft and nontender. No distention. No abdominal bruits. No CVA tenderness. Musculoskeletal: No lower extremity tenderness nor edema.  No joint effusions. Neurologic:  Normal speech and language. No gross focal neurologic deficits are appreciated. No gait instability. Skin:  Skin is warm, dry and intact. No rash noted. Psychiatric: Mood and affect are normal. Speech and behavior are normal.  ____________________________________________   LABS (all labs ordered are listed, but only abnormal results are displayed)  Labs Reviewed - No data to display ____________________________________________  EKG  None ____________________________________________  RADIOLOGY  None ____________________________________________   PROCEDURES  Procedure(s) performed: None  Critical Care performed: No  ____________________________________________   INITIAL IMPRESSION / ASSESSMENT AND PLAN / ED COURSE  Pertinent labs & imaging results that were available during my care of the patient were reviewed by me and considered in my medical decision making (see chart for details).  39 year old female with a history of asthma who presents with nonproductive cough and bronchitis. Will initiate prednisone, Tussionex and follow-up with her PCP. Strict return precautions given. Patient verbalizes understanding and agrees with plan of care. ____________________________________________   FINAL CLINICAL IMPRESSION(S) / ED DIAGNOSES  Final diagnoses:  Bronchitis  Cough      Paulette Blanch, MD 02/01/15 [phone]

## 2015-02-01 NOTE — ED Notes (Signed)
Patient ambulatory to triage with steady gait, without difficulty or distress noted; pt reports last few nights having nonprod cough; +smoker , hx asthma

## 2015-02-01 NOTE — Discharge Instructions (Signed)
1. Take steroid as prescribed (prednisone 60 mg daily 4 days). 2. You may take cough medicine as needed (Tussionex). 3. Return to the ER for worsening symptoms, persistent vomiting, difficulty breathing or other concerns.  Cough, Adult Coughing is a reflex that clears your throat and your airways. Coughing helps to heal and protect your lungs. It is normal to cough occasionally, but a cough that happens with other symptoms or lasts a long time may be a sign of a condition that needs treatment. A cough may last only 2-3 weeks (acute), or it may last longer than 8 weeks (chronic). CAUSES Coughing is commonly caused by:  Breathing in substances that irritate your lungs.  A viral or bacterial respiratory infection.  Allergies.  Asthma.  Postnasal drip.  Smoking.  Acid backing up from the stomach into the esophagus (gastroesophageal reflux).  Certain medicines.  Chronic lung problems, including COPD (or rarely, lung cancer).  Other medical conditions such as heart failure. HOME CARE INSTRUCTIONS  Pay attention to any changes in your symptoms. Take these actions to help with your discomfort:  Take medicines only as told by your health care provider.  If you were prescribed an antibiotic medicine, take it as told by your health care provider. Do not stop taking the antibiotic even if you start to feel better.  Talk with your health care provider before you take a cough suppressant medicine.  Drink enough fluid to keep your urine clear or pale yellow.  If the air is dry, use a cold steam vaporizer or humidifier in your bedroom or your home to help loosen secretions.  Avoid anything that causes you to cough at work or at home.  If your cough is worse at night, try sleeping in a semi-upright position.  Avoid cigarette smoke. If you smoke, quit smoking. If you need help quitting, ask your health care provider.  Avoid caffeine.  Avoid alcohol.  Rest as needed. SEEK MEDICAL  CARE IF:   You have new symptoms.  You cough up pus.  Your cough does not get better after 2-3 weeks, or your cough gets worse.  You cannot control your cough with suppressant medicines and you are losing sleep.  You develop pain that is getting worse or pain that is not controlled with pain medicines.  You have a fever.  You have unexplained weight loss.  You have night sweats. SEEK IMMEDIATE MEDICAL CARE IF:  You cough up blood.  You have difficulty breathing.  Your heartbeat is very fast.   This information is not intended to replace advice given to you by your health care provider. Make sure you discuss any questions you have with your health care provider.   Document Released: 08/09/2010 Document Revised: 11/01/2014 Document Reviewed: 04/19/2014 Elsevier Interactive Patient Education [employer].

## 2015-06-22 DIAGNOSIS — R011 Cardiac murmur, unspecified: Secondary | ICD-10-CM

## 2015-06-22 DIAGNOSIS — Z720 Tobacco use: Secondary | ICD-10-CM

## 2015-06-22 DIAGNOSIS — R1013 Epigastric pain: Secondary | ICD-10-CM

## 2016-01-09 ENCOUNTER — Encounter: Payer: Self-pay | Admitting: *Deleted

## 2016-01-09 ENCOUNTER — Emergency Department
Admission: EM | Admit: 2016-01-09 | Discharge: 2016-01-09 | Disposition: A | Discharge status: Home / Self Care | Payer: Self-pay | Attending: Emergency Medicine | Admitting: Emergency Medicine

## 2016-01-09 DIAGNOSIS — Z8542 Personal history of malignant neoplasm of other parts of uterus: Secondary | ICD-10-CM | POA: Insufficient documentation

## 2016-01-09 DIAGNOSIS — Z791 Long term (current) use of non-steroidal anti-inflammatories (NSAID): Secondary | ICD-10-CM | POA: Insufficient documentation

## 2016-01-09 DIAGNOSIS — F17210 Nicotine dependence, cigarettes, uncomplicated: Secondary | ICD-10-CM | POA: Insufficient documentation

## 2016-01-09 DIAGNOSIS — G43909 Migraine, unspecified, not intractable, without status migrainosus: Secondary | ICD-10-CM | POA: Insufficient documentation

## 2016-01-09 DIAGNOSIS — J45909 Unspecified asthma, uncomplicated: Secondary | ICD-10-CM | POA: Insufficient documentation

## 2016-01-09 DIAGNOSIS — Z7952 Long term (current) use of systemic steroids: Secondary | ICD-10-CM | POA: Insufficient documentation

## 2016-01-09 MED ORDER — SODIUM CHLORIDE 0.9 % IV BOLUS (SEPSIS)
1000.0000 mL | Freq: Once | INTRAVENOUS | Status: AC
  Administered 2016-01-09: 1000 mL via INTRAVENOUS

## 2016-01-09 MED ORDER — KETOROLAC TROMETHAMINE 30 MG/ML IJ SOLN
30.0000 mg | Freq: Once | INTRAMUSCULAR | Status: AC
  Administered 2016-01-09: 30 mg via INTRAVENOUS
  Filled 2016-01-09: qty 1

## 2016-01-09 MED ORDER — DIPHENHYDRAMINE HCL 50 MG/ML IJ SOLN
50.0000 mg | Freq: Once | INTRAMUSCULAR | Status: AC
  Administered 2016-01-09: 50 mg via INTRAVENOUS
  Filled 2016-01-09: qty 1

## 2016-01-09 MED ORDER — METOCLOPRAMIDE HCL 5 MG/ML IJ SOLN
10.0000 mg | Freq: Once | INTRAMUSCULAR | Status: AC
  Administered 2016-01-09: 10 mg via INTRAVENOUS
  Filled 2016-01-09: qty 2

## 2016-01-09 NOTE — ED Triage Notes (Signed)
Pt has a headache for 4 days.  Pt taking tylenol with some relief.  Hx of migraines.  Pt alert.  Speech clear.  Pt reports vomiting today.

## 2016-01-09 NOTE — ED Provider Notes (Signed)
[facility] Emergency Department Provider Note  Time seen: 4:21 PM  I have reviewed the triage vital signs and the nursing notes.   HISTORY  Chief Complaint Headache    HPI Abigail Martinez is a 40 y.o. female with a past medical history of migraines who presents to the emergency department with a migraine headache. According to the patient for the past 4 days she has been experiencing a migraine headache which she describes as moderate. States she has been nauseated with intermittent vomiting, photo and phonophobia. All of which are typical with her migraines. States she will need to go to the hospital for migraine approximately once or twice per year. Denies any focal weakness or numbness. Denies any fever.  Past Medical History:  Diagnosis Date  . Asthma   . Cancer (HCC)    Uterine  . Hepatitis B   . Migraines   . Seizures [facility])     Patient Active Problem List   Diagnosis Date Noted  . Heart murmur 07/26/2014  . Epigastric abdominal pain 07/12/2014  . Tobacco abuse 07/12/2014    Past Surgical History:  Procedure Laterality Date  . CESAREAN SECTION    . CHOLECYSTECTOMY    . DILATION AND CURETTAGE OF UTERUS    . HERNIA REPAIR    . TUBAL LIGATION      Prior to Admission medications   Medication Sig Start Date End Date Taking? Authorizing Provider  chlorpheniramine-HYDROcodone (TUSSIONEX PENNKINETIC ER) 10-8 MG/5ML SUER Take 5 mLs by mouth 2 (two) times daily. 02/01/15   Paulette Blanch, MD  dicyclomine (BENTYL) 20 MG tablet Take 1 tablet (20 mg total) by mouth 4 (four) times daily. 01/01/15 01/01/16  Anne-Caroline Mariea Clonts, MD  diphenhydrAMINE (BENADRYL) 25 mg capsule Take 1 capsule (25 mg total) by mouth every 8 (eight) hours as needed. 07/31/14 07/31/15  Marylene Land, NP  gentamicin (GARAMYCIN) 0.3 % ophthalmic solution Place 1 drop into both eyes 4 (four) times daily. 12/19/14   Sable Feil, PA-C  predniSONE (DELTASONE) 20 MG tablet 3 tablets daily 4  days 02/01/15   Paulette Blanch, MD  simethicone (GAS-X) 80 MG chewable tablet Chew 1 tablet (80 mg total) by mouth 4 (four) times daily as needed for flatulence. 01/01/15 01/01/16  Anne-Caroline Mariea Clonts, MD  traMADol (ULTRAM) 50 MG tablet Take 1 tablet (50 mg total) by mouth every 6 (six) hours as needed for moderate pain. 12/19/14   Sable Feil, PA-C    Allergies  Allergen Reactions  . Strawberry Extract Hives    Family History  Problem Relation Age of Onset  . Cancer Mother     Social History Social History  Substance Use Topics  . Smoking status: Current Every Day Smoker    Packs/day: 0.50    Types: Cigarettes  . Smokeless tobacco: Never Used  . Alcohol use 1.2 oz/week    2 Cans of beer per week    Review of Systems Constitutional: Negative for fever Cardiovascular: Negative for chest pain. Respiratory: Negative for shortness of breath. Gastrointestinal: Negative for abdominal pain Neurological: Positive for headache. Denies focal weakness or numbness. 10-point ROS otherwise negative.  ____________________________________________   PHYSICAL EXAM:  VITAL SIGNS: ED Triage Vitals  Enc Vitals Group     BP 01/09/16 1534 (!) 162/97     Pulse Rate 01/09/16 1534 75     Resp 01/09/16 1534 18     Temp 01/09/16 1534 98.6 F (37 C)     Temp Source  01/09/16 1534 Oral     SpO2 01/09/16 1534 99 %     Weight 01/09/16 1535 138 lb (62.6 kg)     Height 01/09/16 1535 5\' 1"  (1.549 m)     Head Circumference --      Peak Flow --      Pain Score 01/09/16 1535 7     Pain Loc --      Pain Edu? --      Excl. in [place]? --     Constitutional: Alert and oriented. Well appearing and in no distress. Eyes: Normal examMild photophobia, 2-3 millimeters PERRL ENT   Head: Normocephalic and atraumatic.   Mouth/Throat: Mucous membranes are moist. Cardiovascular: Normal rate, regular rhythm. No murmur Respiratory: Normal respiratory effort without tachypnea nor retractions. Breath sounds  are clear  Gastrointestinal: Soft and nontender. No distention Musculoskeletal: Nontender with normal range of motion in all extremities.  Neurologic:  Normal speech and language. Equal grip strengths. No gross deficits. Skin:  Skin is warm, dry and intact.  Psychiatric: Mood and affect are normal.    INITIAL IMPRESSION / ASSESSMENT AND PLAN / ED COURSE  Pertinent labs & imaging results that were available during my care of the patient were reviewed by me and considered in my medical decision making (see chart for details).  Patient presents the emergency department with symptoms consistent with a migraine headache. Patient states the headache is typical of her migraines. We'll treat with a migraine cocktail, IV fluids and closely monitor in the emergency department.   Patient states the headache is much improved. We will discharge home with PCP follow-up. Patient agreeable to plan. ____________________________________________   FINAL CLINICAL IMPRESSION(S) / ED DIAGNOSES  Migraine headache    Harvest Dark, MD 01/09/16 [phone]

## 2016-02-15 ENCOUNTER — Emergency Department
Admission: EM | Admit: 2016-02-15 | Discharge: 2016-02-15 | Disposition: A | Discharge status: Home / Self Care | Payer: Self-pay | Attending: Emergency Medicine | Admitting: Emergency Medicine

## 2016-02-15 ENCOUNTER — Emergency Department: Payer: Self-pay

## 2016-02-15 DIAGNOSIS — Z79899 Other long term (current) drug therapy: Secondary | ICD-10-CM | POA: Insufficient documentation

## 2016-02-15 DIAGNOSIS — J45909 Unspecified asthma, uncomplicated: Secondary | ICD-10-CM | POA: Insufficient documentation

## 2016-02-15 DIAGNOSIS — Y9301 Activity, walking, marching and hiking: Secondary | ICD-10-CM | POA: Insufficient documentation

## 2016-02-15 DIAGNOSIS — F17210 Nicotine dependence, cigarettes, uncomplicated: Secondary | ICD-10-CM | POA: Insufficient documentation

## 2016-02-15 DIAGNOSIS — W1839XA Other fall on same level, initial encounter: Secondary | ICD-10-CM | POA: Insufficient documentation

## 2016-02-15 DIAGNOSIS — Y999 Unspecified external cause status: Secondary | ICD-10-CM | POA: Insufficient documentation

## 2016-02-15 DIAGNOSIS — S6292XA Unspecified fracture of left wrist and hand, initial encounter for closed fracture: Secondary | ICD-10-CM | POA: Insufficient documentation

## 2016-02-15 DIAGNOSIS — Y929 Unspecified place or not applicable: Secondary | ICD-10-CM | POA: Insufficient documentation

## 2016-02-15 DIAGNOSIS — W19XXXA Unspecified fall, initial encounter: Secondary | ICD-10-CM

## 2016-02-15 DIAGNOSIS — S62102A Fracture of unspecified carpal bone, left wrist, initial encounter for closed fracture: Secondary | ICD-10-CM

## 2016-02-15 MED ORDER — HYDROCODONE-ACETAMINOPHEN 5-325 MG PO TABS
1.0000 | ORAL_TABLET | Freq: Once | ORAL | Status: AC
  Administered 2016-02-15: 1 via ORAL

## 2016-02-15 MED ORDER — IBUPROFEN 600 MG PO TABS: 600.0000 mg | ORAL_TABLET | Freq: Three times a day (TID) | ORAL | 0 refills | Status: DC | PRN

## 2016-02-15 MED ORDER — IBUPROFEN 600 MG PO TABS
600.0000 mg | ORAL_TABLET | Freq: Once | ORAL | Status: AC
  Administered 2016-02-15: 600 mg via ORAL
  Filled 2016-02-15: qty 1

## 2016-02-15 MED ORDER — HYDROCODONE-ACETAMINOPHEN 5-325 MG PO TABS
1.0000 | ORAL_TABLET | Freq: Once | ORAL | Status: AC
  Administered 2016-02-15: 1 via ORAL
  Filled 2016-02-15: qty 1

## 2016-02-15 MED ORDER — HYDROCODONE-ACETAMINOPHEN 5-325 MG PO TABS
ORAL_TABLET | ORAL | Status: AC
  Administered 2016-02-15: 1 via ORAL
  Filled 2016-02-15: qty 1

## 2016-02-15 MED ORDER — HYDROCODONE-ACETAMINOPHEN 5-325 MG PO TABS: 1.0000 | ORAL_TABLET | Freq: Four times a day (QID) | ORAL | 0 refills | Status: DC | PRN

## 2016-02-15 NOTE — ED Provider Notes (Signed)
[facility] Emergency Department Provider Note   ____________________________________________   First MD Initiated Contact with Patient 02/15/16 [phone]     (approximate)  I have reviewed the triage vital signs and the nursing notes.   HISTORY  Chief Complaint Wrist Pain    HPI Abigail Martinez is a 40 y.o. female who presents to the ED from home with a chief complaint of left wrist pain. Patient is right hand dominant who states she was sleepwalking, fell and hurt her wrist. Complains of generalized wrist pain, maximally on the volar surface. Also notes a knot to the back of her head. Denies neck pain, vision changes, chest pain, shortness of breath, abdominal pain, nausea, vomiting, diarrhea. Denies extremity weakness, numbness or tingling. Nothing makes her symptoms better. Movement makes her symptoms worse.   Past Medical History:  Diagnosis Date  . Asthma   . Cancer (HCC)    Uterine  . Hepatitis B   . Migraines   . Seizures [facility])     Patient Active Problem List   Diagnosis Date Noted  . Heart murmur 07/26/2014  . Epigastric abdominal pain 07/12/2014  . Tobacco abuse 07/12/2014    Past Surgical History:  Procedure Laterality Date  . CESAREAN SECTION    . CHOLECYSTECTOMY    . DILATION AND CURETTAGE OF UTERUS    . HERNIA REPAIR    . TUBAL LIGATION      Prior to Admission medications   Medication Sig Start Date End Date Taking? Authorizing Provider  chlorpheniramine-HYDROcodone (TUSSIONEX PENNKINETIC ER) 10-8 MG/5ML SUER Take 5 mLs by mouth 2 (two) times daily. 02/01/15   Paulette Blanch, MD  dicyclomine (BENTYL) 20 MG tablet Take 1 tablet (20 mg total) by mouth 4 (four) times daily. 01/01/15 01/01/16  Anne-Caroline Mariea Clonts, MD  diphenhydrAMINE (BENADRYL) 25 mg capsule Take 1 capsule (25 mg total) by mouth every 8 (eight) hours as needed. 07/31/14 07/31/15  Marylene Land, NP  gentamicin (GARAMYCIN) 0.3 % ophthalmic solution Place 1 drop into both eyes  4 (four) times daily. 12/19/14   Sable Feil, PA-C  HYDROcodone-acetaminophen (NORCO) 5-325 MG tablet Take 1 tablet by mouth every 6 (six) hours as needed for moderate pain. 02/15/16   Paulette Blanch, MD  ibuprofen (ADVIL,MOTRIN) 600 MG tablet Take 1 tablet (600 mg total) by mouth every 8 (eight) hours as needed. 02/15/16   Paulette Blanch, MD  predniSONE (DELTASONE) 20 MG tablet 3 tablets daily 4 days 02/01/15   Paulette Blanch, MD  simethicone (GAS-X) 80 MG chewable tablet Chew 1 tablet (80 mg total) by mouth 4 (four) times daily as needed for flatulence. 01/01/15 01/01/16  Anne-Caroline Mariea Clonts, MD  traMADol (ULTRAM) 50 MG tablet Take 1 tablet (50 mg total) by mouth every 6 (six) hours as needed for moderate pain. 12/19/14   Sable Feil, PA-C    Allergies Strawberry extract  Family History  Problem Relation Age of Onset  . Cancer Mother     Social History Social History  Substance Use Topics  . Smoking status: Current Every Day Smoker    Packs/day: 0.50    Types: Cigarettes  . Smokeless tobacco: Never Used  . Alcohol use 1.2 oz/week    2 Cans of beer per week    Review of Systems  Constitutional: No fever/chills. Eyes: No visual changes. ENT: No sore throat. Cardiovascular: Denies chest pain. Respiratory: Denies shortness of breath. Gastrointestinal: No abdominal pain.  No nausea, no vomiting.  No  diarrhea.  No constipation. Genitourinary: Negative for dysuria. Musculoskeletal: Positive for left wrist injury. Negative for back pain. Skin: Negative for rash. Neurological: Negative for headaches, focal weakness or numbness.  10-point ROS otherwise negative.  ____________________________________________   PHYSICAL EXAM:  VITAL SIGNS: ED Triage Vitals  Enc Vitals Group     BP 02/15/16 0406 126/82     Pulse Rate 02/15/16 0406 91     Resp 02/15/16 0406 18     Temp 02/15/16 0406 98.1 F (36.7 C)     Temp Source 02/15/16 0406 Oral     SpO2 02/15/16 0406 97 %     Weight  02/15/16 0404 140 lb (63.5 kg)     Height 02/15/16 0404 5\' 1"  (1.549 m)     Head Circumference --      Peak Flow --      Pain Score 02/15/16 0404 5     Pain Loc --      Pain Edu? --      Excl. in [place]? --     Constitutional: Alert and oriented. Well appearing and in no acute distress. Eyes: Conjunctivae are normal. PERRL. EOMI. Head: Atraumatic. No scalp laceration or hematoma noted Nose: No congestion/rhinnorhea. Mouth/Throat: Mucous membranes are moist.  Oropharynx non-erythematous. Neck: No stridor.  No cervical spine tenderness to palpation. Cardiovascular: Normal rate, regular rhythm. Grossly normal heart sounds.  Good peripheral circulation. Respiratory: Normal respiratory effort.  No retractions. Lungs CTAB. Gastrointestinal: Soft and nontender. No distention. No abdominal bruits. No CVA tenderness. Musculoskeletal: Left wrist with mild swelling. Decreased range of motion secondary to pain. 2+ radial pulses. Brisk, less than 5 second capillary refill. Equal motor strength and sensation. No lower extremity tenderness nor edema.  No joint effusions. Neurologic:  Normal speech and language. No gross focal neurologic deficits are appreciated. No gait instability. Skin:  Skin is warm, dry and intact. No rash noted. Psychiatric: Mood and affect are normal. Speech and behavior are normal.  ____________________________________________   LABS (all labs ordered are listed, but only abnormal results are displayed)  Labs Reviewed - No data to display ____________________________________________  EKG  None ____________________________________________  RADIOLOGY  Left wrist x-rays (viewed by me, interpreted per Dr. Quintella Reichert): Mildly angulated fracture of the distal radius. No dislocation. ____________________________________________   PROCEDURES  Procedure(s) performed:   SPLINT APPLICATION Date/Time: 123456 AM Authorized by: Paulette Blanch Consent: Verbal consent  obtained. Risks and benefits: risks, benefits and alternatives were discussed Consent given by: patient Splint applied by: ED technician Location details: left wrist Splint type: thumb spica Supplies used: OCL Post-procedure: The splinted body part was neurovascularly unchanged following the procedure. Patient tolerance: Patient tolerated the procedure well with no immediate complications.    Procedures  Critical Care performed: No  ____________________________________________   INITIAL IMPRESSION / ASSESSMENT AND PLAN / ED COURSE  Pertinent labs & imaging results that were available during my care of the patient were reviewed by me and considered in my medical decision making (see chart for details).  40 year old female with left wrist pain status post fall. Will obtain plain film imaging studies, administer analgesia and reassess.  Clinical Course as of Feb 15 603  Fri Feb 15, 2016  [id number] Updated patient of imaging results. Will place him thumb spica splint, analgesia and orthopedic follow-up. Strict return precautions given. Patient verbalizes understanding and agrees with plan of care.  [JS]    Clinical Course User Index [JS] Paulette Blanch, MD     ____________________________________________   FINAL  CLINICAL IMPRESSION(S) / ED DIAGNOSES  Final diagnoses:  Fall, initial encounter  Closed fracture of left wrist, initial encounter      NEW MEDICATIONS STARTED DURING THIS VISIT:  New Prescriptions   HYDROCODONE-ACETAMINOPHEN (NORCO) 5-325 MG TABLET    Take 1 tablet by mouth every 6 (six) hours as needed for moderate pain.   IBUPROFEN (ADVIL,MOTRIN) 600 MG TABLET    Take 1 tablet (600 mg total) by mouth every 8 (eight) hours as needed.     Note:  This document was prepared using Dragon voice recognition software and may include unintentional dictation errors.    Paulette Blanch, MD 02/15/16 [phone]

## 2016-02-15 NOTE — Discharge Instructions (Signed)
1. You may take pain medicines as needed (Motrin/Norco #15). 2. Elevate affected area several times daily and apply ice over splint. Keep splint clean and dry. 3. Return to the ER for worsening symptoms, persistent vomiting, numbness/tingling or other concerns.

## 2016-02-15 NOTE — ED Triage Notes (Signed)
Patient ambulatory to triage with steady gait, without difficulty or distress noted; pt reports "I was sleepwalking and I fell and hurt my wrist"; c/o left wrist pain

## 2016-02-15 NOTE — ED Notes (Signed)
Pt. States sleptwalk, fell on unknown surface, woke up on floor with pain to posterior left wrist, and right head

## 2016-02-15 NOTE — ED Notes (Signed)

## 2016-02-19 ENCOUNTER — Emergency Department
Admission: EM | Admit: 2016-02-19 | Discharge: 2016-02-19 | Disposition: A | Discharge status: Home / Self Care | Payer: Self-pay | Attending: Emergency Medicine | Admitting: Emergency Medicine

## 2016-02-19 ENCOUNTER — Encounter: Payer: Self-pay | Admitting: Emergency Medicine

## 2016-02-19 DIAGNOSIS — Z760 Encounter for issue of repeat prescription: Secondary | ICD-10-CM | POA: Insufficient documentation

## 2016-02-19 DIAGNOSIS — F17210 Nicotine dependence, cigarettes, uncomplicated: Secondary | ICD-10-CM | POA: Insufficient documentation

## 2016-02-19 DIAGNOSIS — Z79899 Other long term (current) drug therapy: Secondary | ICD-10-CM | POA: Insufficient documentation

## 2016-02-19 DIAGNOSIS — J45909 Unspecified asthma, uncomplicated: Secondary | ICD-10-CM | POA: Insufficient documentation

## 2016-02-19 MED ORDER — HYDROCODONE-ACETAMINOPHEN 5-325 MG PO TABS: 1.0000 | ORAL_TABLET | ORAL | 0 refills | Status: DC | PRN

## 2016-02-19 MED ORDER — IBUPROFEN 600 MG PO TABS: 600.0000 mg | ORAL_TABLET | Freq: Three times a day (TID) | ORAL | 0 refills | Status: DC | PRN

## 2016-02-19 NOTE — ED Triage Notes (Signed)
Pt reports left wrist fracture, unable to see orthopedist until Thursday. Has appt with [facility].

## 2016-02-19 NOTE — Discharge Instructions (Signed)
Wears splint and sling until evaluation by orthopedics in 2 days.

## 2016-02-19 NOTE — ED Notes (Signed)
Pt fractured left arm is unable to see MD until Thursday, pt is here for a refill of her Vicoden

## 2016-02-19 NOTE — ED Provider Notes (Signed)
[facility] Emergency Department Provider Note   ____________________________________________   First MD Initiated Contact with Patient 02/19/16 1450     (approximate)  I have reviewed the triage vital signs and the nursing notes.   HISTORY  Chief Complaint Medication Refill    HPI Abigail Martinez is a 40 y.o. female patient requests refill her Vicodin pending in 2 days for fracture right wrist.Patient was seen on 02/15/2016 for fractured wrist. Patient has a follow-up appointment with [facility] in 2 days. Patient states she is out of pain medication prescribed for her on her previous visit to this department. Patient continues to wear the splint and sling. Patient rates the pain as a 7/10. Patient described a pain as "achy".   Past Medical History:  Diagnosis Date  . Asthma   . Cancer (HCC)    Uterine  . Hepatitis B   . Migraines   . Seizures [facility])     Patient Active Problem List   Diagnosis Date Noted  . Heart murmur 07/26/2014  . Epigastric abdominal pain 07/12/2014  . Tobacco abuse 07/12/2014    Past Surgical History:  Procedure Laterality Date  . CESAREAN SECTION    . CHOLECYSTECTOMY    . DILATION AND CURETTAGE OF UTERUS    . HERNIA REPAIR    . TUBAL LIGATION      Prior to Admission medications   Medication Sig Start Date End Date Taking? Authorizing Provider  chlorpheniramine-HYDROcodone (TUSSIONEX PENNKINETIC ER) 10-8 MG/5ML SUER Take 5 mLs by mouth 2 (two) times daily. 02/01/15   Paulette Blanch, MD  dicyclomine (BENTYL) 20 MG tablet Take 1 tablet (20 mg total) by mouth 4 (four) times daily. 01/01/15 01/01/16  Anne-Caroline Mariea Clonts, MD  diphenhydrAMINE (BENADRYL) 25 mg capsule Take 1 capsule (25 mg total) by mouth every 8 (eight) hours as needed. 07/31/14 07/31/15  Marylene Land, NP  gentamicin (GARAMYCIN) 0.3 % ophthalmic solution Place 1 drop into both eyes 4 (four) times daily. 12/19/14   Sable Feil, PA-C    HYDROcodone-acetaminophen (NORCO) 5-325 MG tablet Take 1 tablet by mouth every 6 (six) hours as needed for moderate pain. 02/15/16   Paulette Blanch, MD  HYDROcodone-acetaminophen (NORCO/VICODIN) 5-325 MG tablet Take 1 tablet by mouth every 4 (four) hours as needed for moderate pain. 02/19/16 02/18/17  Sable Feil, PA-C  ibuprofen (ADVIL,MOTRIN) 600 MG tablet Take 1 tablet (600 mg total) by mouth every 8 (eight) hours as needed. 02/15/16   Paulette Blanch, MD  ibuprofen (ADVIL,MOTRIN) 600 MG tablet Take 1 tablet (600 mg total) by mouth every 8 (eight) hours as needed. 02/19/16   Sable Feil, PA-C  predniSONE (DELTASONE) 20 MG tablet 3 tablets daily 4 days 02/01/15   Paulette Blanch, MD  simethicone (GAS-X) 80 MG chewable tablet Chew 1 tablet (80 mg total) by mouth 4 (four) times daily as needed for flatulence. 01/01/15 01/01/16  Anne-Caroline Mariea Clonts, MD  traMADol (ULTRAM) 50 MG tablet Take 1 tablet (50 mg total) by mouth every 6 (six) hours as needed for moderate pain. 12/19/14   Sable Feil, PA-C    Allergies Strawberry extract  Family History  Problem Relation Age of Onset  . Cancer Mother     Social History Social History  Substance Use Topics  . Smoking status: Current Every Day Smoker    Packs/day: 0.50    Types: Cigarettes  . Smokeless tobacco: Never Used  . Alcohol use 1.2 oz/week  2 Cans of beer per week    Review of Systems Constitutional: No fever/chills Eyes: No visual changes. ENT: No sore throat. Cardiovascular: Denies chest pain. Respiratory: Denies shortness of breath. Gastrointestinal: No abdominal pain.  No nausea, no vomiting.  No diarrhea.  No constipation. Genitourinary: Negative for dysuria. Musculoskeletal: Negative for back pain. Skin: Negative for rash. Neurological: Negative for headaches, focal weakness or numbness. Seizure disorder Hematological/Lymphatic:Hepatitis B Allergic/Immunilogical: Strawberry  extract. ____________________________________________   PHYSICAL EXAM:  VITAL SIGNS: ED Triage Vitals  Enc Vitals Group     BP 02/19/16 1359 (!) 157/89     Pulse Rate 02/19/16 1359 90     Resp 02/19/16 1359 17     Temp 02/19/16 1359 98.3 F (36.8 C)     Temp Source 02/19/16 1359 Oral     SpO2 02/19/16 1359 99 %     Weight 02/19/16 1359 140 lb (63.5 kg)     Height 02/19/16 1359 5\' 1"  (1.549 m)     Head Circumference --      Peak Flow --      Pain Score 02/19/16 1400 7     Pain Loc --      Pain Edu? --      Excl. in [place]? --     Constitutional: Alert and oriented. Well appearing and in no acute distress. Eyes: Conjunctivae are normal. PERRL. EOMI. Head: Atraumatic. Nose: No congestion/rhinnorhea. Mouth/Throat: Mucous membranes are moist.  Oropharynx non-erythematous. Neck: No stridor.  No cervical spine tenderness to palpation. Hematological/Lymphatic/Immunilogical: No cervical lymphadenopathy. Cardiovascular: Normal rate, regular rhythm. Grossly normal heart sounds.  Good peripheral circulation. Respiratory: Normal respiratory effort.  No retractions. Lungs CTAB. Gastrointestinal: Soft and nontender. No distention. No abdominal bruits. No CVA tenderness. Musculoskeletal: Wearing a splint on the right wrist with a sling secondary to a fracture . Neurologic:  Normal speech and language. No gross focal neurologic deficits are appreciated. No gait instability. Skin:  Skin is warm, dry and intact. No rash noted. Psychiatric: Mood and affect are normal. Speech and behavior are normal.  ____________________________________________   LABS (all labs ordered are listed, but only abnormal results are displayed)  Labs Reviewed - No data to display ____________________________________________  EKG   ____________________________________________  RADIOLOGY   ____________________________________________   PROCEDURES  Procedure(s) performed: None  Procedures  Critical  Care performed: No  ____________________________________________   INITIAL IMPRESSION / ASSESSMENT AND PLAN / ED COURSE  Pertinent labs & imaging results that were available during my care of the patient were reviewed by me and considered in my medical decision making (see chart for details).  Medication refill. Patient Norco was refilled. Patient advised to follow-up with scheduled orthopedic appointment in 2 days.  Clinical Course      ____________________________________________   FINAL CLINICAL IMPRESSION(S) / ED DIAGNOSES  Final diagnoses:  Medication refill      NEW MEDICATIONS STARTED DURING THIS VISIT:  New Prescriptions   HYDROCODONE-ACETAMINOPHEN (NORCO/VICODIN) 5-325 MG TABLET    Take 1 tablet by mouth every 4 (four) hours as needed for moderate pain.   IBUPROFEN (ADVIL,MOTRIN) 600 MG TABLET    Take 1 tablet (600 mg total) by mouth every 8 (eight) hours as needed.     Note:  This document was prepared using Dragon voice recognition software and may include unintentional dictation errors.    Sable Feil, PA-C 02/19/16 1504    Lisa Roca, MD 02/19/16 [phone]

## 2016-06-27 ENCOUNTER — Emergency Department: Payer: Self-pay

## 2016-06-27 ENCOUNTER — Emergency Department
Admission: EM | Admit: 2016-06-27 | Discharge: 2016-06-27 | Disposition: A | Discharge status: Home / Self Care | Payer: Self-pay | Attending: Emergency Medicine | Admitting: Emergency Medicine

## 2016-06-27 DIAGNOSIS — J45909 Unspecified asthma, uncomplicated: Secondary | ICD-10-CM | POA: Insufficient documentation

## 2016-06-27 DIAGNOSIS — J209 Acute bronchitis, unspecified: Secondary | ICD-10-CM | POA: Insufficient documentation

## 2016-06-27 DIAGNOSIS — Z8542 Personal history of malignant neoplasm of other parts of uterus: Secondary | ICD-10-CM | POA: Insufficient documentation

## 2016-06-27 DIAGNOSIS — F17210 Nicotine dependence, cigarettes, uncomplicated: Secondary | ICD-10-CM | POA: Insufficient documentation

## 2016-06-27 MED ORDER — PREDNISONE 10 MG PO TABS: 50.0000 mg | ORAL_TABLET | Freq: Every day | ORAL | 0 refills | Status: DC

## 2016-06-27 MED ORDER — IPRATROPIUM-ALBUTEROL 0.5-2.5 (3) MG/3ML IN SOLN
3.0000 mL | Freq: Once | RESPIRATORY_TRACT | Status: AC
  Administered 2016-06-27: 3 mL via RESPIRATORY_TRACT
  Filled 2016-06-27: qty 3

## 2016-06-27 MED ORDER — PREDNISONE 20 MG PO TABS
60.0000 mg | ORAL_TABLET | Freq: Once | ORAL | Status: AC
  Administered 2016-06-27: 60 mg via ORAL
  Filled 2016-06-27: qty 3

## 2016-06-27 MED ORDER — GUAIFENESIN-CODEINE 100-10 MG/5ML PO SOLN: 10.0000 mL | Freq: Three times a day (TID) | ORAL | 0 refills | Status: DC | PRN

## 2016-06-27 MED ORDER — ALBUTEROL SULFATE HFA 108 (90 BASE) MCG/ACT IN AERS: 2.0000 | INHALATION_SPRAY | RESPIRATORY_TRACT | 1 refills | Status: DC | PRN

## 2016-06-27 NOTE — ED Triage Notes (Signed)
Pt c/o nonproductive cough since April. Hx of asthma. States cough worse when outside. Pt alert and oriented X4, active, cooperative, pt in NAD. RR even and unlabored, color WNL.

## 2016-06-27 NOTE — ED Provider Notes (Signed)
[facility] Emergency Department Provider Note  ____________________________________________  Time seen: Approximately 2:17 PM  I have reviewed the triage vital signs and the nursing notes.   HISTORY  Chief Complaint Cough   HPI Abigail Martinez is a 41 y.o. female who presents to the emergency department for evaluation of shortness of breath. Cough started in April and has gradually worsened. She is out of her albuterol inhaler. She works outside [employer] and she feels that the pollen has made her symptoms much worse. She has taken over-the-counter medications to try and alleviate the cough without success.    Past Medical History:  Diagnosis Date  . Asthma   . Cancer (HCC)    Uterine  . Hepatitis B   . Migraines   . Seizures [facility])     Patient Active Problem List   Diagnosis Date Noted  . Heart murmur 07/26/2014  . Epigastric abdominal pain 07/12/2014  . Tobacco abuse 07/12/2014    Past Surgical History:  Procedure Laterality Date  . CESAREAN SECTION    . CHOLECYSTECTOMY    . DILATION AND CURETTAGE OF UTERUS    . HERNIA REPAIR    . TUBAL LIGATION      Prior to Admission medications   Medication Sig Start Date End Date Taking? Authorizing Provider  albuterol (PROVENTIL HFA;VENTOLIN HFA) 108 (90 Base) MCG/ACT inhaler Inhale 2 puffs into the lungs every 4 (four) hours as needed for wheezing or shortness of breath. 06/27/16   Victorino Dike, FNP  chlorpheniramine-HYDROcodone (TUSSIONEX PENNKINETIC ER) 10-8 MG/5ML SUER Take 5 mLs by mouth 2 (two) times daily. 02/01/15   Paulette Blanch, MD  dicyclomine (BENTYL) 20 MG tablet Take 1 tablet (20 mg total) by mouth 4 (four) times daily. 01/01/15 01/01/16  Anne-Caroline Mariea Clonts, MD  diphenhydrAMINE (BENADRYL) 25 mg capsule Take 1 capsule (25 mg total) by mouth every 8 (eight) hours as needed. 07/31/14 07/31/15  Marylene Land, NP  gentamicin (GARAMYCIN) 0.3 % ophthalmic solution Place 1 drop into both eyes 4  (four) times daily. 12/19/14   Sable Feil, PA-C  guaiFENesin-codeine 100-10 MG/5ML syrup Take 10 mLs by mouth 3 (three) times daily as needed. 06/27/16   Victorino Dike, FNP  HYDROcodone-acetaminophen (NORCO) 5-325 MG tablet Take 1 tablet by mouth every 6 (six) hours as needed for moderate pain. 02/15/16   Paulette Blanch, MD  HYDROcodone-acetaminophen (NORCO/VICODIN) 5-325 MG tablet Take 1 tablet by mouth every 4 (four) hours as needed for moderate pain. 02/19/16 02/18/17  Sable Feil, PA-C  ibuprofen (ADVIL,MOTRIN) 600 MG tablet Take 1 tablet (600 mg total) by mouth every 8 (eight) hours as needed. 02/15/16   Paulette Blanch, MD  ibuprofen (ADVIL,MOTRIN) 600 MG tablet Take 1 tablet (600 mg total) by mouth every 8 (eight) hours as needed. 02/19/16   Sable Feil, PA-C  predniSONE (DELTASONE) 10 MG tablet Take 5 tablets (50 mg total) by mouth daily. 06/27/16   Victorino Dike, FNP  simethicone (GAS-X) 80 MG chewable tablet Chew 1 tablet (80 mg total) by mouth 4 (four) times daily as needed for flatulence. 01/01/15 01/01/16  Anne-Caroline Mariea Clonts, MD  traMADol (ULTRAM) 50 MG tablet Take 1 tablet (50 mg total) by mouth every 6 (six) hours as needed for moderate pain. 12/19/14   Sable Feil, PA-C    Allergies Strawberry extract  Family History  Problem Relation Age of Onset  . Cancer Mother     Social History Social History  Substance Use Topics  . Smoking status: Current Every Day Smoker    Packs/day: 0.50    Types: Cigarettes  . Smokeless tobacco: Never Used  . Alcohol use 1.2 oz/week    2 Cans of beer per week    Review of Systems Constitutional: No fever/chills ENT: No sore throat. Cardiovascular: Denies chest pain. Respiratory: Positive for shortness of breath. Positive for cough. Gastrointestinal: Negative for nausea,  Negative for vomiting.  Negative for diarrhea.  Musculoskeletal: Negative for body aches Skin: Negative for rash. Neurological: Negative for  headaches ____________________________________________   PHYSICAL EXAM:  VITAL SIGNS: ED Triage Vitals  Enc Vitals Group     BP 06/27/16 1317 (!) 134/98     Pulse Rate 06/27/16 1317 (!) 101     Resp 06/27/16 1317 20     Temp 06/27/16 1317 98.8 F (37.1 C)     Temp Source 06/27/16 1317 Oral     SpO2 06/27/16 1317 100 %     Weight 06/27/16 1317 143 lb (64.9 kg)     Height 06/27/16 1317 5\' 1"  (1.549 m)     Head Circumference --      Peak Flow --      Pain Score 06/27/16 1332 6     Pain Loc --      Pain Edu? --      Excl. in [place]? --     Constitutional: Alert and oriented. Well appearing and in no acute distress. Eyes: Conjunctivae are normal. EOMI. Ears: Bilateral tympanic membranes are normal Nose: No nasal congestion noted; no rhinnorhea. Mouth/Throat: Mucous membranes are moist.  Oropharynx without erythema. Tonsils 1+ without exudate. Neck: No stridor.  Lymphatic: No cervical lymphadenopathy. Cardiovascular: Normal rate, regular rhythm. Good peripheral circulation. Respiratory: Normal respiratory effort.  No retractions. Poor air exchange and faint expiratory wheezes in all fields. Gastrointestinal: Soft and nontender.  Musculoskeletal: FROM x 4 extremities.  Neurologic:  Normal speech and language.  Skin:  Skin is warm, dry and intact. No rash noted. Psychiatric: Mood and affect are normal. Speech and behavior are normal.  ____________________________________________   LABS (all labs ordered are listed, but only abnormal results are displayed)  Labs Reviewed - No data to display ____________________________________________  EKG  Not indicated ____________________________________________  RADIOLOGY  Chest x-ray negative for acute cardiopulmonary abnormality per radiology. ____________________________________________   PROCEDURES  Procedure(s) performed: None  Critical Care performed: No ____________________________________________   INITIAL  IMPRESSION / ASSESSMENT AND PLAN / ED COURSE  41 year old female presenting to the emergency department for treatment of shortness of breath and cough. Symptoms have been present for the past month or so. She has been out of her albuterol inhaler for quite some time because she has not had any asthma exacerbations. While in the emergency department today, she was given a DuoNeb treatment and 60 mg of prednisone with significant improvement in air exchange and resolution of expiratory wheezes. She will be given a prescription for albuterol, prednisone, and Robitussin-AC. She was instructed to establish primary care as soon as possible and was given information about [facility]. She was instructed to return to the emergency department for any symptom that changes or worsens if she is unable to schedule an appointment.  Pertinent labs & imaging results that were available during my care of the patient were reviewed by me and considered in my medical decision making (see chart for details).  Discharge Medication List as of 06/27/2016  3:09 PM    START taking these  medications   Details  albuterol (PROVENTIL HFA;VENTOLIN HFA) 108 (90 Base) MCG/ACT inhaler Inhale 2 puffs into the lungs every 4 (four) hours as needed for wheezing or shortness of breath., Starting Fri 06/27/2016, Print    guaiFENesin-codeine 100-10 MG/5ML syrup Take 10 mLs by mouth 3 (three) times daily as needed., Starting Fri 06/27/2016, Print        If controlled substance prescribed during this visit, 12 month history viewed on the [place] prior to issuing an initial prescription for Schedule II or III opiod. ____________________________________________   FINAL CLINICAL IMPRESSION(S) / ED DIAGNOSES  Final diagnoses:  Acute bronchitis, unspecified organism    Note:  This document was prepared using Dragon voice recognition software and may include unintentional dictation errors.     Victorino Dike,  FNP 06/27/16 1656    Orbie Pyo, MD 06/29/16 [phone]

## 2016-06-29 ENCOUNTER — Emergency Department: Payer: Self-pay

## 2016-06-29 ENCOUNTER — Emergency Department
Admission: EM | Admit: 2016-06-29 | Discharge: 2016-06-29 | Disposition: A | Discharge status: Home / Self Care | Payer: Self-pay | Attending: Student in an Organized Health Care Education/Training Program | Admitting: Student in an Organized Health Care Education/Training Program

## 2016-06-29 DIAGNOSIS — J189 Pneumonia, unspecified organism: Secondary | ICD-10-CM | POA: Insufficient documentation

## 2016-06-29 DIAGNOSIS — Z79899 Other long term (current) drug therapy: Secondary | ICD-10-CM | POA: Insufficient documentation

## 2016-06-29 DIAGNOSIS — J40 Bronchitis, not specified as acute or chronic: Secondary | ICD-10-CM | POA: Insufficient documentation

## 2016-06-29 DIAGNOSIS — Z8542 Personal history of malignant neoplasm of other parts of uterus: Secondary | ICD-10-CM | POA: Insufficient documentation

## 2016-06-29 DIAGNOSIS — F17210 Nicotine dependence, cigarettes, uncomplicated: Secondary | ICD-10-CM | POA: Insufficient documentation

## 2016-06-29 LAB — CBC WITH DIFFERENTIAL/PLATELET
Basophils Absolute: 0 10*3/uL (ref 0–0.1)
Basophils Relative: 0 %
EOS ABS: 0 10*3/uL (ref 0–0.7)
Eosinophils Relative: 0 %
HEMATOCRIT: 38 % (ref 35.0–47.0)
HEMOGLOBIN: 13.3 g/dL (ref 12.0–16.0)
LYMPHS ABS: 1.9 10*3/uL (ref 1.0–3.6)
Lymphocytes Relative: 19 %
MCH: 32.8 pg (ref 26.0–34.0)
MCHC: 35 g/dL (ref 32.0–36.0)
MCV: 93.9 fL (ref 80.0–100.0)
MONOS PCT: 8 %
Monocytes Absolute: 0.8 10*3/uL (ref 0.2–0.9)
NEUTROS ABS: 7.1 10*3/uL — AB (ref 1.4–6.5)
NEUTROS PCT: 73 %
PLATELETS: 241 10*3/uL (ref 150–440)
RBC: 4.05 MIL/uL (ref 3.80–5.20)
RDW: 13.9 % (ref 11.5–14.5)
WBC: 9.9 10*3/uL (ref 3.6–11.0)

## 2016-06-29 LAB — BASIC METABOLIC PANEL
Anion gap: 14 (ref 5–15)
BUN: 8 mg/dL (ref 6–20)
CO2: 22 mmol/L (ref 22–32)
CREATININE: 0.67 mg/dL (ref 0.44–1.00)
Calcium: 8.7 mg/dL — ABNORMAL LOW (ref 8.9–10.3)
Chloride: 104 mmol/L (ref 101–111)
GFR calc Af Amer: >60 mL/min (ref >60)
GLUCOSE: 102 mg/dL — AB (ref 65–99)
POTASSIUM: 2.9 mmol/L — AB (ref 3.5–5.1)
Sodium: 140 mmol/L (ref 135–145)

## 2016-06-29 LAB — MAGNESIUM: Magnesium: 1.7 mg/dL (ref 1.7–2.4)

## 2016-06-29 MED ORDER — DOXYCYCLINE HYCLATE 50 MG PO CAPS: 100.0000 mg | ORAL_CAPSULE | Freq: Two times a day (BID) | ORAL | 0 refills | Status: AC

## 2016-06-29 MED ORDER — ALBUTEROL SULFATE HFA 108 (90 BASE) MCG/ACT IN AERS: 2.0000 | INHALATION_SPRAY | Freq: Four times a day (QID) | RESPIRATORY_TRACT | 2 refills | Status: DC | PRN

## 2016-06-29 MED ORDER — KETOROLAC TROMETHAMINE 30 MG/ML IJ SOLN
15.0000 mg | Freq: Once | INTRAMUSCULAR | Status: AC
  Administered 2016-06-29: 15 mg via INTRAVENOUS
  Filled 2016-06-29: qty 1

## 2016-06-29 MED ORDER — DEXAMETHASONE SODIUM PHOSPHATE 10 MG/ML IJ SOLN
10.0000 mg | Freq: Once | INTRAMUSCULAR | Status: AC
Start: 2016-06-29 — End: 2016-06-29
  Administered 2016-06-29: 10 mg via INTRAVENOUS
  Filled 2016-06-29: qty 1

## 2016-06-29 MED ORDER — ALBUTEROL SULFATE (2.5 MG/3ML) 0.083% IN NEBU
INHALATION_SOLUTION | RESPIRATORY_TRACT | Status: AC
  Administered 2016-06-29: 5 mg via RESPIRATORY_TRACT
  Filled 2016-06-29: qty 6

## 2016-06-29 MED ORDER — ALBUTEROL SULFATE (2.5 MG/3ML) 0.083% IN NEBU
5.0000 mg | INHALATION_SOLUTION | Freq: Once | RESPIRATORY_TRACT | Status: AC
Start: 2016-06-29 — End: 2016-06-29
  Administered 2016-06-29: 5 mg via RESPIRATORY_TRACT
  Filled 2016-06-29: qty 6

## 2016-06-29 MED ORDER — ALBUTEROL SULFATE (2.5 MG/3ML) 0.083% IN NEBU
5.0000 mg | INHALATION_SOLUTION | Freq: Once | RESPIRATORY_TRACT | Status: AC
  Administered 2016-06-29: 5 mg via RESPIRATORY_TRACT

## 2016-06-29 MED ORDER — DOXYCYCLINE HYCLATE 100 MG PO TABS
100.0000 mg | ORAL_TABLET | Freq: Once | ORAL | Status: AC
  Administered 2016-06-29: 100 mg via ORAL
  Filled 2016-06-29: qty 1

## 2016-06-29 MED ORDER — ALBUTEROL SULFATE (2.5 MG/3ML) 0.083% IN NEBU
2.5000 mg | INHALATION_SOLUTION | Freq: Once | RESPIRATORY_TRACT | Status: AC
Start: 2016-06-29 — End: 2016-06-29
  Administered 2016-06-29: 2.5 mg via RESPIRATORY_TRACT
  Filled 2016-06-29: qty 3

## 2016-06-29 MED ORDER — POTASSIUM CHLORIDE CRYS ER 20 MEQ PO TBCR
40.0000 meq | EXTENDED_RELEASE_TABLET | Freq: Once | ORAL | Status: AC
  Administered 2016-06-29: 40 meq via ORAL
  Filled 2016-06-29: qty 2

## 2016-06-29 MED ORDER — MAGNESIUM SULFATE 2 GM/50ML IV SOLN
2.0000 g | Freq: Once | INTRAVENOUS | Status: AC
  Administered 2016-06-29: 2 g via INTRAVENOUS
  Filled 2016-06-29: qty 50

## 2016-06-29 NOTE — ED Notes (Signed)
Pt ambulated with a heart rate at 101 bpm and SaO2 of 95% on RA. Pt ambulated with a steady gait, with no distress noted. Pt did not report SOB or pain while walking.

## 2016-06-29 NOTE — ED Triage Notes (Signed)
Patient reports seen 2 days ago for similar symptoms, but was unable to afford her inhaler.  Reports unable to take a deep breath and has continuous cough.  Lungs with wheezing and rhonci bilaterally.

## 2016-06-29 NOTE — ED Provider Notes (Signed)
[facility] Emergency Department Provider Note    First MD Initiated Contact with Patient 06/29/16 [phone]     (approximate)  I have reviewed the triage vital signs and the nursing notes.   HISTORY  Chief Complaint Shortness of Breath    HPI Abigail Martinez is a 41 y.o. female presents with worsening shortness of breath and intractable cough tonight. Patient was seen for similar symptoms 2 days ago and treated for bronchitis. Was unable to purchase the inhaler prescribed but has been taking steroids. Has been having some productive cough but no measured fevers at home. She does smoke daily. Denies any chest pain. States that she was coughing so hard that she did vomit.   Past Medical History:  Diagnosis Date  . Asthma   . Cancer (HCC)    Uterine  . Hepatitis B   . Migraines   . Seizures ([place])    Family History  Problem Relation Age of Onset  . Cancer Mother    Past Surgical History:  Procedure Laterality Date  . CESAREAN SECTION    . CHOLECYSTECTOMY    . DILATION AND CURETTAGE OF UTERUS    . HERNIA REPAIR    . TUBAL LIGATION     Patient Active Problem List   Diagnosis Date Noted  . Heart murmur 07/26/2014  . Epigastric abdominal pain 07/12/2014  . Tobacco abuse 07/12/2014      Prior to Admission medications   Medication Sig Start Date End Date Taking? Authorizing Provider  albuterol (PROVENTIL HFA;VENTOLIN HFA) 108 (90 Base) MCG/ACT inhaler Inhale 2 puffs into the lungs every 4 (four) hours as needed for wheezing or shortness of breath. 06/27/16  Yes Triplett, Cari B, FNP  guaiFENesin-codeine 100-10 MG/5ML syrup Take 10 mLs by mouth 3 (three) times daily as needed. 06/27/16  Yes Triplett, Cari B, FNP  predniSONE (DELTASONE) 10 MG tablet Take 5 tablets (50 mg total) by mouth daily. 06/27/16  Yes Triplett, Cari B, FNP  chlorpheniramine-HYDROcodone (TUSSIONEX PENNKINETIC ER) 10-8 MG/5ML SUER Take 5 mLs by mouth 2 (two) times daily. Patient not  taking: Reported on 06/29/2016 02/01/15   Paulette Blanch, MD  dicyclomine (BENTYL) 20 MG tablet Take 1 tablet (20 mg total) by mouth 4 (four) times daily. 01/01/15 01/01/16  Eula Listen, MD  diphenhydrAMINE (BENADRYL) 25 mg capsule Take 1 capsule (25 mg total) by mouth every 8 (eight) hours as needed. 07/31/14 07/31/15  Marylene Land, NP  gentamicin (GARAMYCIN) 0.3 % ophthalmic solution Place 1 drop into both eyes 4 (four) times daily. Patient not taking: Reported on 06/29/2016 12/19/14   Sable Feil, PA-C  simethicone (GAS-X) 80 MG chewable tablet Chew 1 tablet (80 mg total) by mouth 4 (four) times daily as needed for flatulence. 01/01/15 01/01/16  Eula Listen, MD    Allergies Strawberry extract    Social History Social History  Substance Use Topics  . Smoking status: Current Every Day Smoker    Packs/day: 0.50    Types: Cigarettes  . Smokeless tobacco: Never Used  . Alcohol use 1.2 oz/week    2 Cans of beer per week    Review of Systems Patient denies headaches, rhinorrhea, blurry vision, numbness, shortness of breath, chest pain, edema, cough, abdominal pain, nausea, vomiting, diarrhea, dysuria, fevers, rashes or hallucinations unless otherwise stated above in HPI. ____________________________________________   PHYSICAL EXAM:  VITAL SIGNS: There were no vitals filed for this visit.  Constitutional: Alert and oriented. Well appearing and in no acute  distress. Eyes: Conjunctivae are normal. PERRL. EOMI. Head: Atraumatic. Nose: No congestion/rhinnorhea. Mouth/Throat: Mucous membranes are moist.  Oropharynx non-erythematous. Neck: No stridor. Painless ROM. No cervical spine tenderness to palpation Hematological/Lymphatic/Immunilogical: No cervical lymphadenopathy. Cardiovascular: Normal rate, regular rhythm. Grossly normal heart sounds.  Good peripheral circulation. Respiratory: Normal respiratory effort.  No retractions. Lungs with coarse bronchial breathsounds  with expiratory wheeze Gastrointestinal: Soft and nontender. No distention. No abdominal bruits. No CVA tenderness. Genitourinary:  Musculoskeletal: No lower extremity tenderness nor edema.  No joint effusions. Neurologic:  Normal speech and language. No gross focal neurologic deficits are appreciated. No gait instability. Skin:  Skin is warm, dry and intact. No rash noted. Psychiatric: Mood and affect are normal. Speech and behavior are normal.  ____________________________________________   LABS (all labs ordered are listed, but only abnormal results are displayed)  No results found for this or any previous visit (from the past 24 hour(s)). ____________________________________________ ____________________________________________  KZSWFUXNA  I personally reviewed all radiographic images ordered to evaluate for the above acute complaints and reviewed radiology reports and findings.  These findings were personally discussed with the patient.  Please see medical record for radiology report.  ____________________________________________   PROCEDURES  Procedure(s) performed:  Procedures    Critical Care performed: no ____________________________________________   INITIAL IMPRESSION / ASSESSMENT AND PLAN / ED COURSE  Pertinent labs & imaging results that were available during my care of the patient were reviewed by me and considered in my medical decision making (see chart for details).  DDX: Asthma, copd, CHF, pna, ptx, malignancy, Pe, anemia   Abigail Martinez is a 41 y.o. who presents to the ED with  Chief complaint of cough and discomfort as described above. Patient otherwise afebrile and hemodynamically stable. Examined chest x-ray was consistent with acute bronchitis with probable component of community-acquired pneumonia. Symptoms likely worsened due to the patient not being able to fill her inhaler. Will be given nebulizer treatments. Will give additional dose of steroids  while here. Is clinically not consistent with pulmonary embolism. Denies any chest pain to suggest ACS. Blood work ordered to evaluate there is significant abnormality shows mild hypokalemia and borderline low magnesium. Given her bronchitis will give dose of magnesium for repletion as well as oral potassium. Patient given a dose of antibiotics. Patient was able to ambulate with a steady gait without any desaturation or significant dyspnea. Patient is stable for further management as an outpatient.  Have discussed with the patient and available family all diagnostics and treatments performed thus far and all questions were answered to the best of my ability. The patient demonstrates understanding and agreement with plan.      ____________________________________________   FINAL CLINICAL IMPRESSION(S) / ED DIAGNOSES  Final diagnoses:  Bronchitis  Community acquired pneumonia, unspecified laterality      NEW MEDICATIONS STARTED DURING THIS VISIT:  New Prescriptions   No medications on file     Note:  This document was prepared using Dragon voice recognition software and may include unintentional dictation errors.    Merlyn Lot, MD 06/29/16 [phone]

## 2016-10-29 ENCOUNTER — Emergency Department
Admission: EM | Admit: 2016-10-29 | Discharge: 2016-10-29 | Disposition: A | Discharge status: Home / Self Care | Payer: Self-pay | Attending: Emergency Medicine | Admitting: Emergency Medicine

## 2016-10-29 ENCOUNTER — Encounter: Payer: Self-pay | Admitting: Medical Oncology

## 2016-10-29 ENCOUNTER — Emergency Department: Payer: Self-pay

## 2016-10-29 DIAGNOSIS — Z8542 Personal history of malignant neoplasm of other parts of uterus: Secondary | ICD-10-CM | POA: Insufficient documentation

## 2016-10-29 DIAGNOSIS — F17210 Nicotine dependence, cigarettes, uncomplicated: Secondary | ICD-10-CM | POA: Insufficient documentation

## 2016-10-29 DIAGNOSIS — Z79899 Other long term (current) drug therapy: Secondary | ICD-10-CM | POA: Insufficient documentation

## 2016-10-29 DIAGNOSIS — M25572 Pain in left ankle and joints of left foot: Secondary | ICD-10-CM | POA: Insufficient documentation

## 2016-10-29 DIAGNOSIS — J45909 Unspecified asthma, uncomplicated: Secondary | ICD-10-CM | POA: Insufficient documentation

## 2016-10-29 DIAGNOSIS — M25472 Effusion, left ankle: Secondary | ICD-10-CM | POA: Insufficient documentation

## 2016-10-29 MED ORDER — MELOXICAM 7.5 MG PO TABS: 7.5000 mg | ORAL_TABLET | Freq: Every day | ORAL | 0 refills | Status: DC

## 2016-10-29 NOTE — Discharge Instructions (Signed)
Take medication as prescribed. Return to emergency department if symptoms worsen and follow-up with PCP as needed.    Follow-up with orthopedics referral if symptoms do not resolve.

## 2016-10-29 NOTE — ED Triage Notes (Signed)
Pt reports left ankle pain x 2 days without injury.

## 2016-10-29 NOTE — ED Provider Notes (Signed)
[facility] Emergency Department Provider Note   ____________________________________________   I have reviewed the triage vital signs and the nursing notes.   HISTORY  Chief Complaint Ankle Pain    HPI Abigail Martinez is a 41 y.o. female presents emergent department with left lateral ankle pain for approximately 2 days. Patient denies any acute injury although she has a new mattress and noted onset of ankle pain when she started sleeping on the mattress. She feels the pressure from the mattress may be causing the pain. patient reports in addition to pain difficulty weightbearing and walking. Patient denies any changes in sensation or weakness to the left lower extremity Patient denies fever, chills, headache, vision changes, chest pain, chest tightness, shortness of breath, abdominal pain, nausea and vomiting.  Past Medical History:  Diagnosis Date  . Asthma   . Cancer (HCC)    Uterine  . Hepatitis B   . Migraines   . Seizures [facility])     Patient Active Problem List   Diagnosis Date Noted  . Heart murmur 07/26/2014  . Epigastric abdominal pain 07/12/2014  . Tobacco abuse 07/12/2014    Past Surgical History:  Procedure Laterality Date  . CESAREAN SECTION    . CHOLECYSTECTOMY    . DILATION AND CURETTAGE OF UTERUS    . HERNIA REPAIR    . TUBAL LIGATION      Prior to Admission medications   Medication Sig Start Date End Date Taking? Authorizing Provider  albuterol (PROVENTIL HFA;VENTOLIN HFA) 108 (90 Base) MCG/ACT inhaler Inhale 2 puffs into the lungs every 4 (four) hours as needed for wheezing or shortness of breath. 06/27/16   Triplett, Cari B, FNP  albuterol (PROVENTIL HFA;VENTOLIN HFA) 108 (90 Base) MCG/ACT inhaler Inhale 2 puffs into the lungs every 6 (six) hours as needed for wheezing or shortness of breath. 06/29/16   Merlyn Lot, MD  chlorpheniramine-HYDROcodone [facility] PENNKINETIC ER) 10-8 MG/5ML SUER Take 5 mLs by mouth 2 (two)  times daily. Patient not taking: Reported on 06/29/2016 02/01/15   Paulette Blanch, MD  dicyclomine (BENTYL) 20 MG tablet Take 1 tablet (20 mg total) by mouth 4 (four) times daily. 01/01/15 01/01/16  Eula Listen, MD  diphenhydrAMINE (BENADRYL) 25 mg capsule Take 1 capsule (25 mg total) by mouth every 8 (eight) hours as needed. 07/31/14 07/31/15  Marylene Land, NP  gentamicin (GARAMYCIN) 0.3 % ophthalmic solution Place 1 drop into both eyes 4 (four) times daily. Patient not taking: Reported on 06/29/2016 12/19/14   Sable Feil, PA-C  guaiFENesin-codeine 100-10 MG/5ML syrup Take 10 mLs by mouth 3 (three) times daily as needed. 06/27/16   Triplett, Cari B, FNP  meloxicam (MOBIC) 7.5 MG tablet Take 1 tablet (7.5 mg total) by mouth daily. 10/29/16 10/29/17  Markeeta Scalf M, PA-C  predniSONE (DELTASONE) 10 MG tablet Take 5 tablets (50 mg total) by mouth daily. 06/27/16   Triplett, Johnette Abraham B, FNP  simethicone (GAS-X) 80 MG chewable tablet Chew 1 tablet (80 mg total) by mouth 4 (four) times daily as needed for flatulence. 01/01/15 01/01/16  Eula Listen, MD    Allergies Strawberry extract  Family History  Problem Relation Age of Onset  . Cancer Mother     Social History Social History  Substance Use Topics  . Smoking status: Current Every Day Smoker    Packs/day: 0.50    Types: Cigarettes  . Smokeless tobacco: Never Used  . Alcohol use 1.2 oz/week    2 Cans of beer  per week    Review of Systems Constitutional: Negative for fever/chills Eyes: No visual changes. ENT:  Negative for sore throat and for difficulty swallowing Cardiovascular: Denies chest pain. Respiratory: Denies cough. Denies shortness of breath. Gastrointestinal: No abdominal pain.  No nausea, vomiting, diarrhea. Genitourinary: Negative for dysuria. Musculoskeletal: Left lateral ankle pain with mild swelling. Skin: Negative for rash. Neurological: Negative for headaches. Able to  ambulate. ____________________________________________   PHYSICAL EXAM:  VITAL SIGNS: ED Triage Vitals  Enc Vitals Group     BP 10/29/16 1118 (!) 140/98     Pulse Rate 10/29/16 1118 (!) 18     Resp 10/29/16 1118 16     Temp 10/29/16 1118 98 F (36.7 C)     Temp Source 10/29/16 1118 Oral     SpO2 10/29/16 1118 97 %     Weight 10/29/16 1116 143 lb (64.9 kg)     Height --      Head Circumference --      Peak Flow --      Pain Score 10/29/16 1115 4     Pain Loc --      Pain Edu? --      Excl. in [place]? --     Constitutional: Alert and oriented. Well appearing and in no acute distress.  Eyes: Conjunctivae are normal. PERRL. EOMI  Head: Normocephalic and atraumatic. ENT:      Ears: Canals clear. TMs intact bilaterally.      Nose: No congestion/rhinnorhea.      Mouth/Throat: Mucous membranes are moist. Neck:Supple. No thyromegaly. No stridor.  Cardiovascular: Normal rate, regular rhythm. Normal S1 and S2.  Good peripheral circulation. Respiratory: Normal respiratory effort without tachypnea or retractions. Lungs CTAB. No wheezes/rales/rhonchi.  Hematological/Lymphatic/Immunological: No cervical lymphadenopathy. Cardiovascular: Normal rate, regular rhythm. Normal distal pulses. Gastrointestinal: Bowel sounds 4 quadrants. Soft and nontender to palpation. Musculoskeletal: Left lateral ankle pain. Intact ROM, sensation. No deformities noted. Mild swelling along distal fibula.  Neurologic: Normal speech and language.  Skin:  Skin is warm, dry and intact. No rash noted. Psychiatric: Mood and affect are normal. Speech and behavior are normal. Patient exhibits appropriate insight and judgement.  ____________________________________________   LABS (all labs ordered are listed, but only abnormal results are displayed)  Labs Reviewed - No data to display ____________________________________________  EKG None ____________________________________________  RADIOLOGY DG ankle  complete left FINDINGS: Cortical irregularity at the tip of the lateral malleolus is unchanged and most consistent with remote healed fracture. Imaged bones otherwise appear normal. Joint spaces are preserved. Soft tissues are unremarkable.  IMPRESSION: Remote healed fracture of the tip of the lateral malleolus. The examination is otherwise negative. ____________________________________________   PROCEDURES  Procedure(s) performed: no    Critical Care performed: no ____________________________________________   INITIAL IMPRESSION / ASSESSMENT AND PLAN / ED COURSE  Pertinent labs & imaging results that were available during my care of the patient were reviewed by me and considered in my medical decision making (see chart for details).  Patient presents to emergency department with left lateral ankle pain with swelling. History, physical exam findings and imaging are reassuring of no acute fracture. Symptosm are consistent with mild ankle strain/strain. Patient will be prescribed meloxicam for inflammation and pain. Recommended utilizing crutches until symptoms improved. Patient advised to follow up with PCP as needed or return to the emergency department if symptoms return or worsen. Patient informed of clinical course, understand medical decision-making process, and agree with plan.       If controlled  substance prescribed during this visit, 12 month history viewed on the [place] prior to issuing an initial prescription for Schedule II or III opiod. ____________________________________________   FINAL CLINICAL IMPRESSION(S) / ED DIAGNOSES  Final diagnoses:  Acute left ankle pain  Left ankle swelling       NEW MEDICATIONS STARTED DURING THIS VISIT:  Discharge Medication List as of 10/29/2016 12:38 PM    START taking these medications   Details  meloxicam (MOBIC) 7.5 MG tablet Take 1 tablet (7.5 mg total) by mouth daily., Starting Wed 10/29/2016, Until Thu 10/29/2017,  Print         Note:  This document was prepared using Dragon voice recognition software and may include unintentional dictation errors.    Jerolyn Shin, PA-C 10/29/16 1859    Earleen Newport, MD 10/30/16 [phone]

## 2017-03-12 IMAGING — DX DG WRIST COMPLETE 3+V*L*
4 series · 4 of 4 positions shown · non-contrast
Comparison: None.

CLINICAL DATA: 40-year-old female with fall and trauma to the left
wrist.

EXAM:
LEFT WRIST - COMPLETE 3+ VIEW

[wrist ap (1 of 2)]
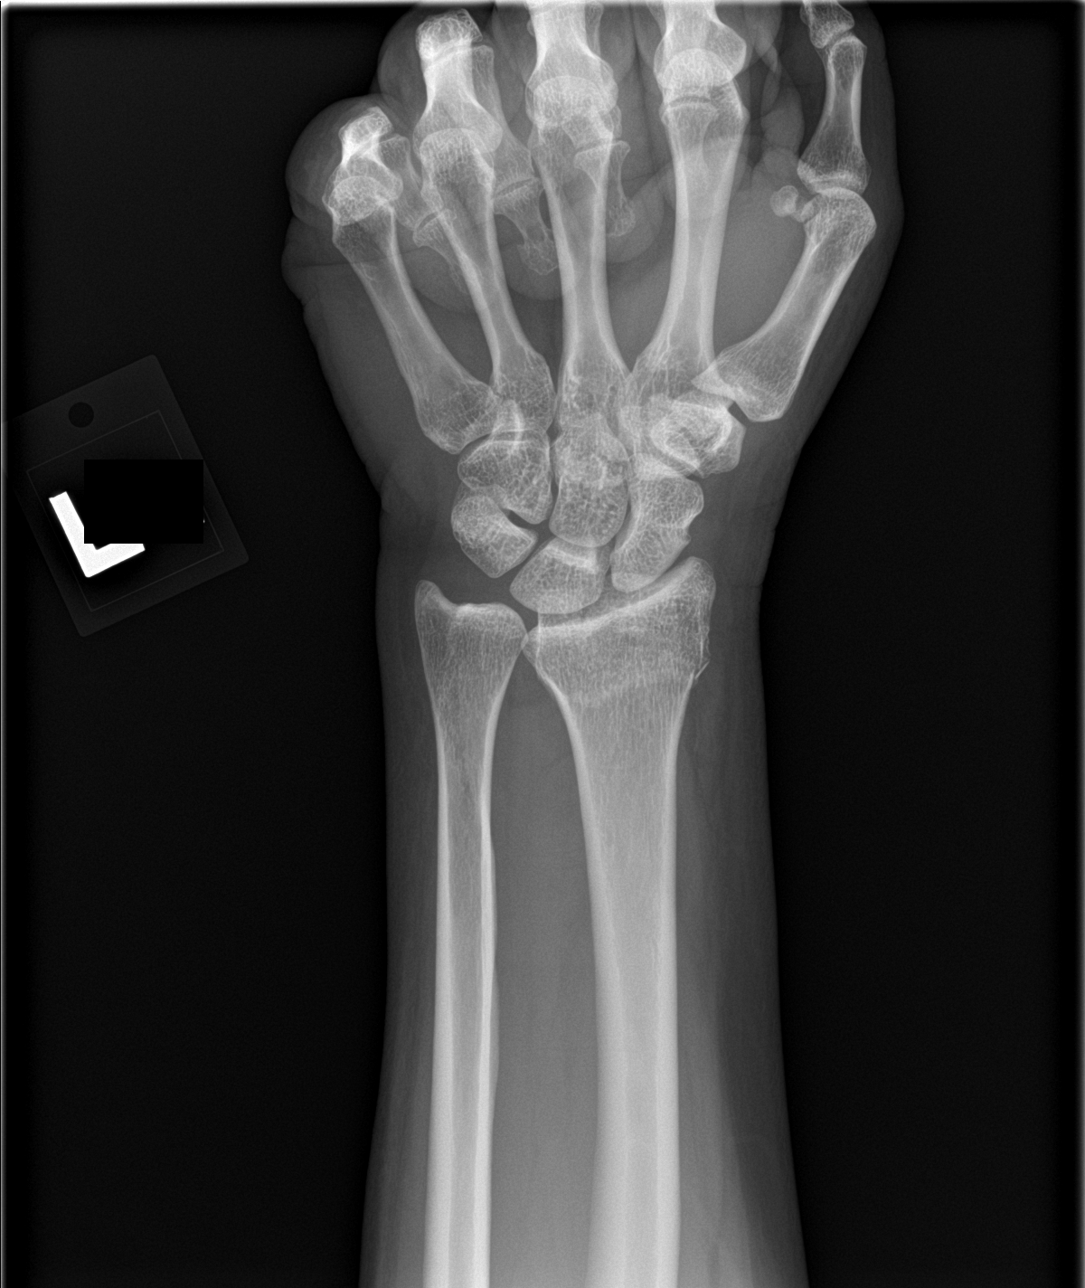

[wrist obl]
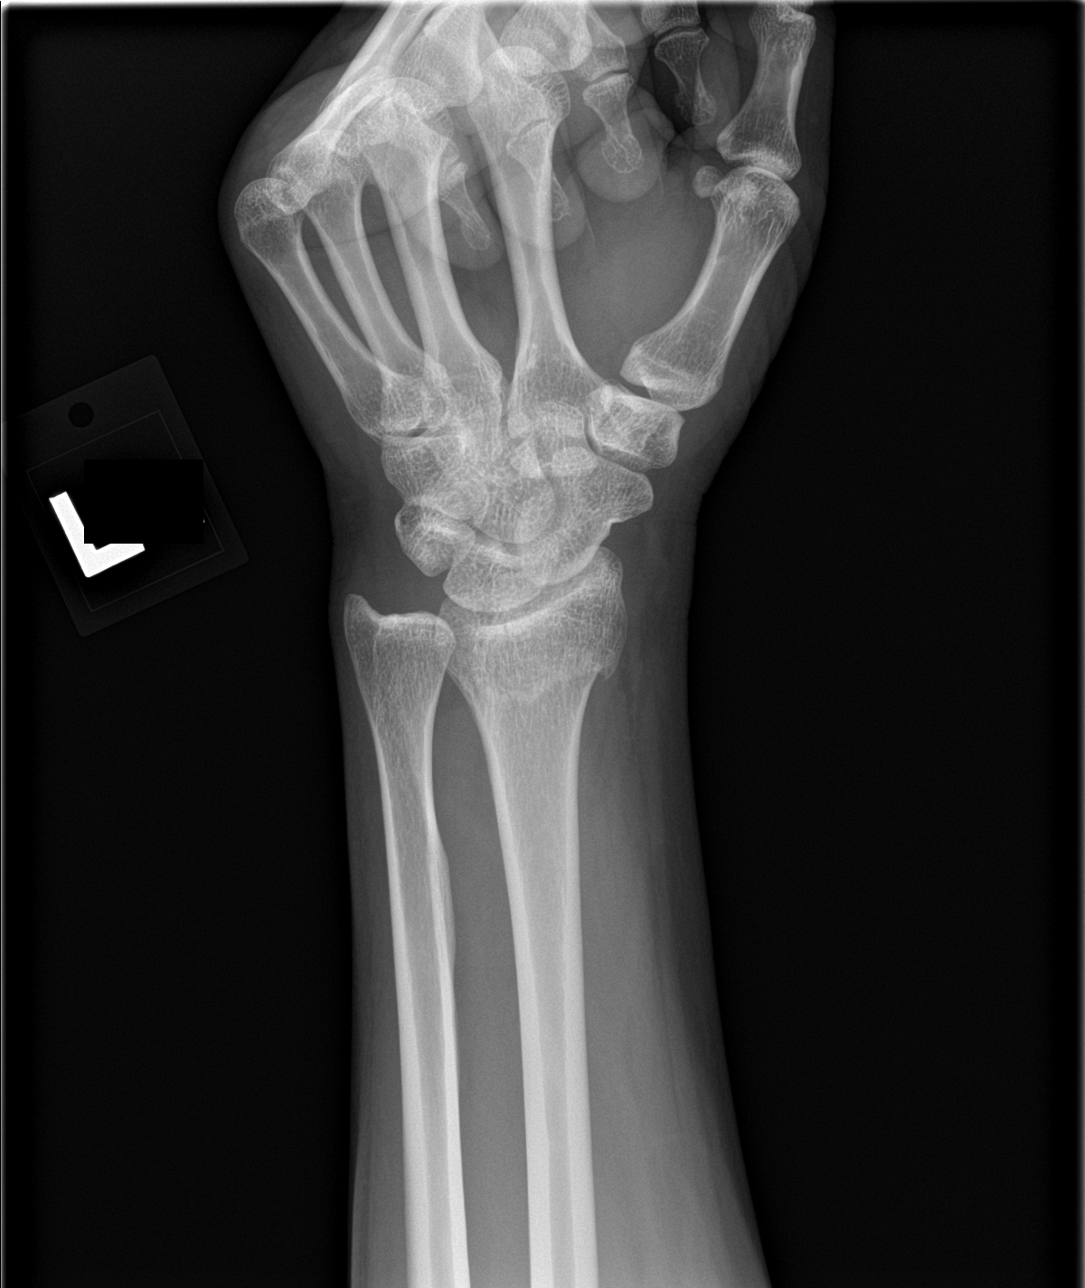

[wrist lat]
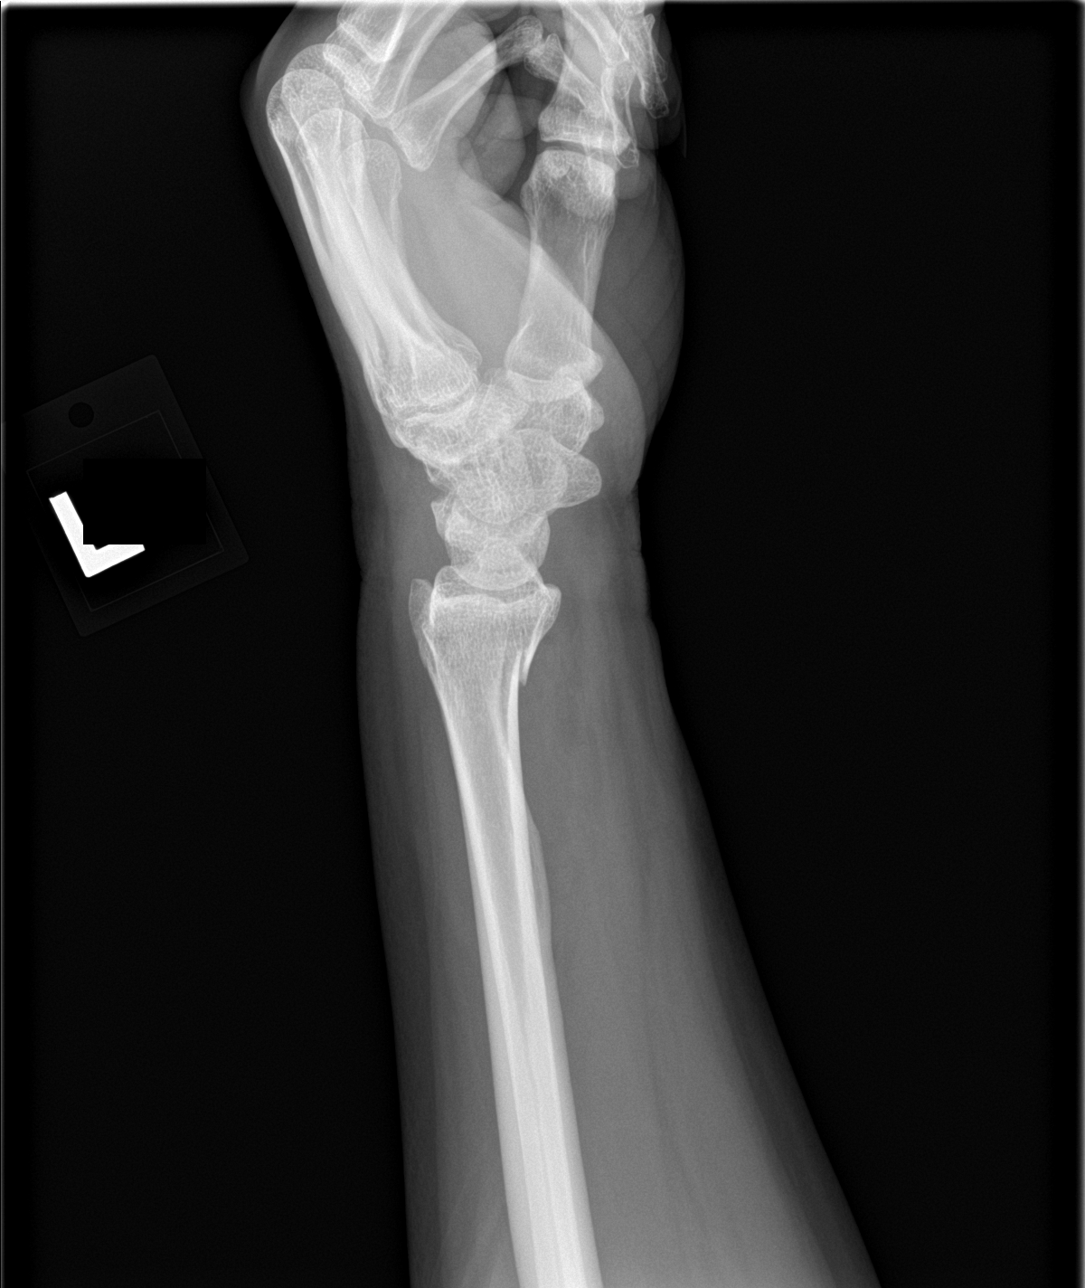

[wrist ap (2 of 2)]
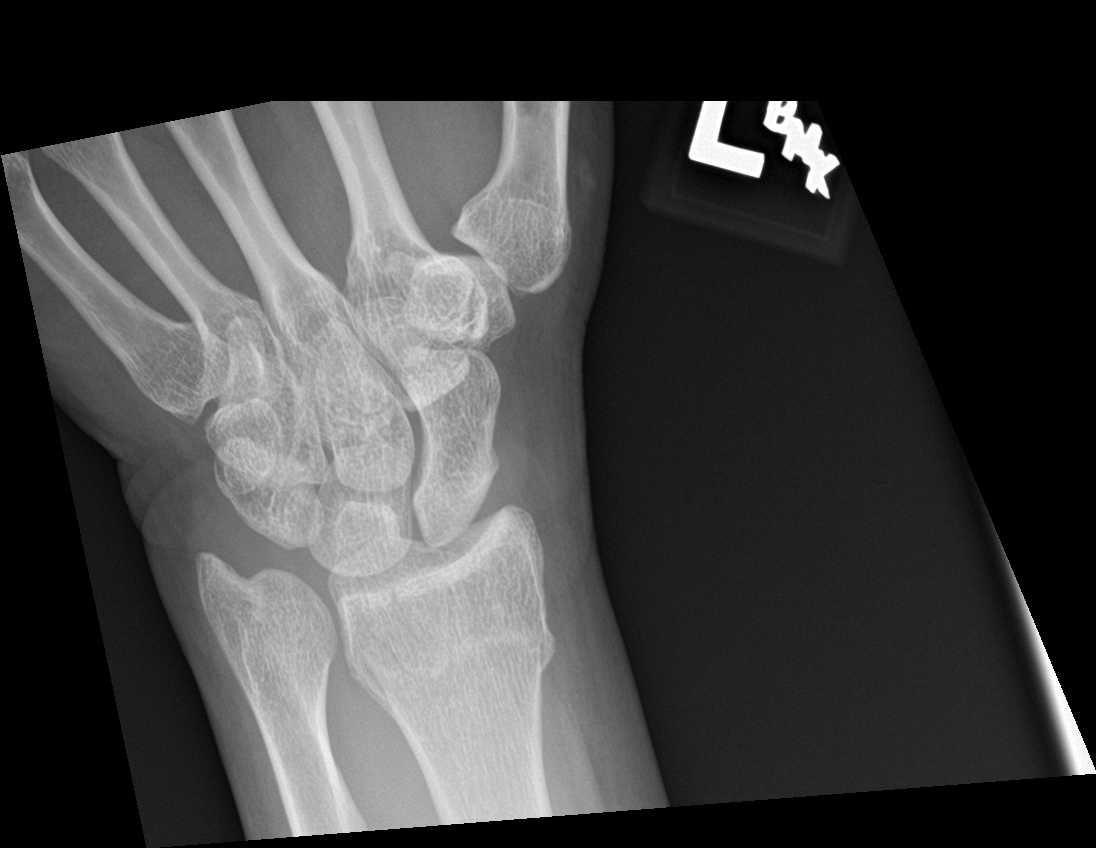

[4 of 4 positions shown; findings below may reference images not displayed]

FINDINGS: There is a fracture of the distal radial metaphysis with mild volar
angulation. A linear lucency involving the articular surface of the
distal radius on the AP view may be artifactual. Evaluation for
intra-articular extension of the fracture is however limited due to
suboptimal visualization. No other acute fracture identified. There
is no dislocation. The soft tissue swelling of the wrist. No
radiopaque foreign object.
IMPRESSION: Mildly angulated fracture of the distal radius.  No dislocation.

## 2017-08-20 ENCOUNTER — Emergency Department
Admission: EM | Admit: 2017-08-20 | Discharge: 2017-08-20 | Disposition: A | Discharge status: Home / Self Care | Payer: Self-pay | Attending: Emergency Medicine | Admitting: Emergency Medicine

## 2017-08-20 ENCOUNTER — Other Ambulatory Visit: Payer: Self-pay

## 2017-08-20 DIAGNOSIS — F17210 Nicotine dependence, cigarettes, uncomplicated: Secondary | ICD-10-CM | POA: Insufficient documentation

## 2017-08-20 DIAGNOSIS — J45909 Unspecified asthma, uncomplicated: Secondary | ICD-10-CM | POA: Insufficient documentation

## 2017-08-20 DIAGNOSIS — K047 Periapical abscess without sinus: Secondary | ICD-10-CM | POA: Insufficient documentation

## 2017-08-20 MED ORDER — AMOXICILLIN 500 MG PO TABS: 500.0000 mg | ORAL_TABLET | Freq: Three times a day (TID) | ORAL | 0 refills | Status: DC

## 2017-08-20 MED ORDER — NAPROXEN 500 MG PO TABS: 500.0000 mg | ORAL_TABLET | Freq: Two times a day (BID) | ORAL | 0 refills | Status: DC

## 2017-08-20 NOTE — Discharge Instructions (Signed)
Please call and schedule a dental appointment as soon as possible. You will need to be seen within the next 14 days. Return to the emergency department for symptoms that change or worsen if you're unable to schedule an appointment.  OPTIONS FOR DENTAL FOLLOW UP CARE  [place] Department of Health and Human Services - Local Safety Net Dental Clinics http://www.ncdhhs.gov/dph/oralhealth/services/safetynetclinics.htm   [facility] ([phone])  [facility] ([phone])  [facility] ([phone])  [facility] ([phone])  [facility] ([phone]) This clinic caters to the indigent population and is on a lottery system. Location: [facility], [address], [place] Clinic Hours: Wednesdays from 6pm - 9pm, patients seen by a lottery system. For dates, call or go to [email or web address]/[facility]/[email or web address] Services: Cleanings, fillings and simple extractions. Payment Options: DENTAL WORK IS FREE OF CHARGE. Bring proof of income or support. Best way to get seen: Arrive at 5:15 pm - this is a lottery, NOT first come/first serve, so arriving earlier will not increase your chances of being seen.     [facility] [phone] Select option 1 for emergencies   Location: [facility], [address], [place] Clinic Hours: No walk-ins accepted - call the day before to schedule an appointment. Check in times are 9:30 am and 1:30 pm. Services: Simple extractions, temporary fillings, pulpectomy/pulp debridement, uncomplicated abscess drainage. Payment Options: PAYMENT IS DUE AT THE TIME OF SERVICE.  Fee is usually $100-200, additional surgical procedures (e.g. abscess drainage) may be extra. Cash, checks, Visa/MasterCard accepted.  Can file Medicaid if patient is covered for dental - patient should call case worker to check. No  discount for [facility] patients. Best way to get seen: MUST call the day before and get onto the schedule. Can usually be seen the next 1-2 days. No walk-ins accepted.     [facility] [phone]   Location: [facility], [address], [place] Clinic Hours: M, W, Th, F 8am or 1:30pm, Tues 9a or 1:30 - first come/first served. Services: Simple extractions, temporary fillings, uncomplicated abscess drainage.  You do not need to be an [place] resident. Payment Options: PAYMENT IS DUE AT THE TIME OF SERVICE. Dental insurance, otherwise sliding scale - bring proof of income or support. Depending on income and treatment needed, cost is usually $50-200. Best way to get seen: Arrive early as it is first come/first served.     [facility] [phone]   Location: [address] Clinic Hours: Mon-Thu 8a-5p Services: Most basic dental services including extractions and fillings. Payment Options: PAYMENT IS DUE AT THE TIME OF SERVICE. Sliding scale, up to 50% off - bring proof if income or support. Medicaid with dental option accepted. Best way to get seen: Call to schedule an appointment, can usually be seen within 2 weeks OR they will try to see walk-ins - show up at 8a or 2p (you may have to wait).     [facility] [phone] [place] RESIDENTS ONLY   Location: [facility], [address], [place], [place] [address] Clinic Hours: By appointment only. Monday - Thursday 8am-5pm, Friday 8am-12pm Services: Cleanings, fillings, extractions. Payment Options: PAYMENT IS DUE AT THE TIME OF SERVICE. Cash, Visa or MasterCard. Sliding scale - $30 minimum per service. Best way to get seen: Come in to office, complete packet and make an appointment -   need proof of income or support monies for each household member and proof of [facility]  residence. Usually takes about a month to get in.     [facility] [phone]   Location: [address], [place] Clinic Hours: Walk-in Urgent Care Dental Services are offered Monday-Friday mornings only. The numbers of emergencies accepted daily is limited to the number of providers available. Maximum 15 - Mondays, Wednesdays & Thursdays Maximum 10 - Tuesdays & Fridays Services: You do not need to be a [facility] resident to be seen for a dental emergency. Emergencies are defined as pain, swelling, abnormal bleeding, or dental trauma. Walkins will receive x-rays if needed. NOTE: Dental cleaning is not an emergency. Payment Options: PAYMENT IS DUE AT THE TIME OF SERVICE. Minimum co-pay is $40.00 for uninsured patients. Minimum co-pay is $3.00 for Medicaid with dental coverage. Dental Insurance is accepted and must be presented at time of visit. Medicare does not cover dental. Forms of payment: Cash, credit card, checks. Best way to get seen: If not previously registered with the clinic, walk-in dental registration begins at 7:15 am and is on a first come/first serve basis. If previously registered with the clinic, call to make an appointment.     [facility] [place] ONLY   Location: [address] [place], [place] Clinic Hours: Mon-Thu 10a-2p Services: Extractions only! Payment Options: FREE (donations accepted) - bring proof of income or support Best way to get seen: Call and schedule an appointment OR come at 8am on the 1st Monday of every month (except for holidays) when it is first come/first served.     [facility] [phone]   Location: [place], [place] Clinic Hours: Friday mornings Services, Payment Options, Best way to get seen: Call for info

## 2017-08-20 NOTE — ED Provider Notes (Signed)
[facility] Emergency Department Provider Note ____________________________________________  Time seen: Approximately 3:25 PM  I have reviewed the triage vital signs and the nursing notes.   HISTORY  Chief Complaint Dental Pain   HPI Abigail Martinez is a 42 y.o. female who presents to the emergency department for treatment and evaluation of dental pain that started 4 days ago. She has a broken tooth on the bottom left. Swelling increased overnight. No relief with ibuprofen.  Past Medical History:  Diagnosis Date  . Asthma   . Cancer (HCC)    Uterine  . Hepatitis B   . Migraines   . Seizures [facility])     Patient Active Problem List   Diagnosis Date Noted  . Heart murmur 07/26/2014  . Epigastric abdominal pain 07/12/2014  . Tobacco abuse 07/12/2014    Past Surgical History:  Procedure Laterality Date  . CESAREAN SECTION    . CHOLECYSTECTOMY    . DILATION AND CURETTAGE OF UTERUS    . HERNIA REPAIR    . TUBAL LIGATION      Prior to Admission medications   Medication Sig Start Date End Date Taking? Authorizing Provider  albuterol (PROVENTIL HFA;VENTOLIN HFA) 108 (90 Base) MCG/ACT inhaler Inhale 2 puffs into the lungs every 4 (four) hours as needed for wheezing or shortness of breath. 06/27/16   Eder Macek B, FNP  albuterol (PROVENTIL HFA;VENTOLIN HFA) 108 (90 Base) MCG/ACT inhaler Inhale 2 puffs into the lungs every 6 (six) hours as needed for wheezing or shortness of breath. 06/29/16   Merlyn Lot, MD  amoxicillin (AMOXIL) 500 MG tablet Take 1 tablet (500 mg total) by mouth 3 (three) times daily. 08/20/17   Glorianne Proctor, Johnette Abraham B, FNP  chlorpheniramine-HYDROcodone (TUSSIONEX PENNKINETIC ER) 10-8 MG/5ML SUER Take 5 mLs by mouth 2 (two) times daily. Patient not taking: Reported on 06/29/2016 02/01/15   Paulette Blanch, MD  dicyclomine (BENTYL) 20 MG tablet Take 1 tablet (20 mg total) by mouth 4 (four) times daily. 01/01/15 01/01/16  Eula Listen, MD    diphenhydrAMINE (BENADRYL) 25 mg capsule Take 1 capsule (25 mg total) by mouth every 8 (eight) hours as needed. 07/31/14 07/31/15  Marylene Land, NP  gentamicin (GARAMYCIN) 0.3 % ophthalmic solution Place 1 drop into both eyes 4 (four) times daily. Patient not taking: Reported on 06/29/2016 12/19/14   Sable Feil, PA-C  guaiFENesin-codeine 100-10 MG/5ML syrup Take 10 mLs by mouth 3 (three) times daily as needed. 06/27/16   Jaivion Kingsley B, FNP  meloxicam (MOBIC) 7.5 MG tablet Take 1 tablet (7.5 mg total) by mouth daily. 10/29/16 10/29/17  Little, Traci M, PA-C  naproxen (NAPROSYN) 500 MG tablet Take 1 tablet (500 mg total) by mouth 2 (two) times daily with a meal. 08/20/17   Tarae Wooden B, FNP  predniSONE (DELTASONE) 10 MG tablet Take 5 tablets (50 mg total) by mouth daily. 06/27/16   Alanda Colton, Johnette Abraham B, FNP  simethicone (GAS-X) 80 MG chewable tablet Chew 1 tablet (80 mg total) by mouth 4 (four) times daily as needed for flatulence. 01/01/15 01/01/16  Eula Listen, MD    Allergies Strawberry extract  Family History  Problem Relation Age of Onset  . Cancer Mother     Social History Social History   Tobacco Use  . Smoking status: Current Every Day Smoker    Packs/day: 0.50    Types: Cigarettes  . Smokeless tobacco: Never Used  Substance Use Topics  . Alcohol use: Yes    Alcohol/week: 1.2  oz    Types: 2 Cans of beer per week  . Drug use: No    Review of Systems Constitutional: Negative for fever or recent illness. ENT: Positive for dental pain. Musculoskeletal: Negative for trismus of the jaw.  Skin: Negative for wound or lesion. ____________________________________________   PHYSICAL EXAM:  VITAL SIGNS: ED Triage Vitals  Enc Vitals Group     BP 08/20/17 1501 (!) 139/96     Pulse Rate 08/20/17 1501 84     Resp 08/20/17 1501 16     Temp 08/20/17 1501 99.1 F (37.3 C)     Temp Source 08/20/17 1501 Oral     SpO2 08/20/17 1501 97 %     Weight 08/20/17 1500 122 lb (55.3  kg)     Height 08/20/17 1500 5\' 1"  (1.549 m)     Head Circumference --      Peak Flow --      Pain Score 08/20/17 1500 8     Pain Loc --      Pain Edu? --      Excl. in [place]? --     Constitutional: Alert and oriented. Well appearing and in no acute distress. Eyes: Conjunctiva are clear without discharge or drainage. Mouth/Throat: Airway is patent. Periodontal Exam    Hematological/Lymphatic/Immunilogical: No palpable cervical adenopathy. Respiratory: Respirations even and unlabored. Musculoskeletal: Full ROM of the jaw. Neurologic: Awake, alert, oriented.  Skin:  Left side facial swelling without erythema or edema. Psychiatric: Affect and behavior intact.  ____________________________________________   LABS (all labs ordered are listed, but only abnormal results are displayed)  Labs Reviewed - No data to display ____________________________________________   RADIOLOGY  Not indicated. ____________________________________________   PROCEDURES  Procedure(s) performed:   Procedures  Critical Care performed: No ____________________________________________   INITIAL IMPRESSION / ASSESSMENT AND PLAN / ED COURSE  [name] is a 42 y.o. female who presents to the ER for dental pain. She was prescribed amoxicillin and naprosyn and will follow up with a dentist when possible. She is to return to the ER for symptoms that change or worsen if unable to schedule an appointment.  Pertinent labs & imaging results that were available during my care of the patient were reviewed by me and considered in my medical decision making (see chart for details).  ____________________________________________   FINAL CLINICAL IMPRESSION(S) / ED DIAGNOSES  Final diagnoses:  Dental abscess    Discharge Medication List as of 08/20/2017  3:33 PM    START taking these medications   Details  amoxicillin (AMOXIL) 500 MG tablet Take 1 tablet (500 mg total) by mouth 3 (three) times  daily., Starting Thu 08/20/2017, Print    naproxen (NAPROSYN) 500 MG tablet Take 1 tablet (500 mg total) by mouth 2 (two) times daily with a meal., Starting Thu 08/20/2017, Print        If controlled substance prescribed during this visit, 12 month history viewed on the [facility] prior to issuing an initial prescription for Schedule II or III opiod.  Note:  This document was prepared using Dragon voice recognition software and may include unintentional dictation errors.    Victorino Dike, FNP 08/20/17 [place], [place], MD 08/22/17 1538

## 2017-08-20 NOTE — ED Triage Notes (Signed)
Left lower jaw dental pain x 4 days.

## 2017-08-20 NOTE — ED Notes (Signed)
See triage note  Presents with pain to lower jaw area  Possible dental abscess

## 2017-10-14 ENCOUNTER — Emergency Department
Admission: EM | Admit: 2017-10-14 | Discharge: 2017-10-14 | Disposition: A | Discharge status: Home / Self Care | Payer: Self-pay | Attending: Emergency Medicine | Admitting: Emergency Medicine

## 2017-10-14 ENCOUNTER — Encounter: Payer: Self-pay | Admitting: Emergency Medicine

## 2017-10-14 DIAGNOSIS — J45909 Unspecified asthma, uncomplicated: Secondary | ICD-10-CM | POA: Insufficient documentation

## 2017-10-14 DIAGNOSIS — K029 Dental caries, unspecified: Secondary | ICD-10-CM | POA: Insufficient documentation

## 2017-10-14 DIAGNOSIS — F17210 Nicotine dependence, cigarettes, uncomplicated: Secondary | ICD-10-CM | POA: Insufficient documentation

## 2017-10-14 MED ORDER — PENICILLIN V POTASSIUM 500 MG PO TABS: 500.0000 mg | ORAL_TABLET | Freq: Four times a day (QID) | ORAL | 0 refills | Status: DC

## 2017-10-14 MED ORDER — IBUPROFEN 600 MG PO TABS: 600.0000 mg | ORAL_TABLET | Freq: Three times a day (TID) | ORAL | 0 refills | Status: DC | PRN

## 2017-10-14 NOTE — ED Provider Notes (Signed)
[facility] Emergency Department Provider Note   ____________________________________________   First MD Initiated Contact with Patient 10/14/17 [phone]     (approximate)  I have reviewed the triage vital signs and the nursing notes.   HISTORY  Chief Complaint Dental Pain   HPI Abigail Martinez is a 42 y.o. female is to the emergency department with complaint of dental pain.  Patient states that she just recently took penicillin and it got better.  She states that is now coming back.  Patient has not seen a dentist for her dental pain.  Rates her pain as 7 out of 10.  Past Medical History:  Diagnosis Date  . Asthma   . Cancer (HCC)    Uterine  . Hepatitis B   . Migraines   . Seizures [facility])     Patient Active Problem List   Diagnosis Date Noted  . Heart murmur 07/26/2014  . Epigastric abdominal pain 07/12/2014  . Tobacco abuse 07/12/2014    Past Surgical History:  Procedure Laterality Date  . CESAREAN SECTION    . CHOLECYSTECTOMY    . DILATION AND CURETTAGE OF UTERUS    . HERNIA REPAIR    . TUBAL LIGATION      Prior to Admission medications   Medication Sig Start Date End Date Taking? Authorizing Provider  albuterol (PROVENTIL HFA;VENTOLIN HFA) 108 (90 Base) MCG/ACT inhaler Inhale 2 puffs into the lungs every 4 (four) hours as needed for wheezing or shortness of breath. 06/27/16   Triplett, Cari B, FNP  ibuprofen (ADVIL,MOTRIN) 600 MG tablet Take 1 tablet (600 mg total) by mouth every 8 (eight) hours as needed. 10/14/17   Johnn Hai, PA-C  penicillin v potassium (VEETID) 500 MG tablet Take 1 tablet (500 mg total) by mouth 4 (four) times daily. 10/14/17   Johnn Hai, PA-C    Allergies Strawberry extract  Family History  Problem Relation Age of Onset  . Cancer Mother     Social History Social History   Tobacco Use  . Smoking status: Current Every Day Smoker    Packs/day: 0.50    Types: Cigarettes  . Smokeless tobacco:  Never Used  Substance Use Topics  . Alcohol use: Yes    Alcohol/week: 2.0 standard drinks    Types: 2 Cans of beer per week  . Drug use: No    Review of Systems Constitutional: No fever/chills Eyes: No visual changes. ENT: Positive dental pain. Cardiovascular: Denies chest pain. Respiratory: Denies shortness of breath. Skin: Negative for rash. Neurological: Negative for headaches ___________________________________________   PHYSICAL EXAM:  VITAL SIGNS: ED Triage Vitals  Enc Vitals Group     BP 10/14/17 0709 134/89     Pulse Rate 10/14/17 0709 79     Resp 10/14/17 0709 20     Temp 10/14/17 0709 98.3 F (36.8 C)     Temp Source 10/14/17 0709 Oral     SpO2 10/14/17 0709 98 %     Weight 10/14/17 0710 117 lb (53.1 kg)     Height 10/14/17 0710 5\' 1"  (1.549 m)     Head Circumference --      Peak Flow --      Pain Score 10/14/17 0709 7     Pain Loc --      Pain Edu? --      Excl. in [place]? --     Constitutional: Alert and oriented. Well appearing and in no acute distress. Eyes: Conjunctivae are normal.  Head: Atraumatic. Nose: No congestion/rhinnorhea. Mouth/Throat: Mucous membranes are moist.  Oropharynx non-erythematous.  There is moderate tenderness dental and gum to the left lower molar.  No active drainage is noted. Neck: No stridor.   Hematological/Lymphatic/Immunilogical: No cervical lymphadenopathy. Cardiovascular: Normal rate, regular rhythm. Grossly normal heart sounds.  Good peripheral circulation. Respiratory: Normal respiratory effort.  No retractions. Lungs CTAB. Musculoskeletal: Moves upper and lower extremities that any difficulty.  Normal gait was noted. Neurologic:  Normal speech and language. No gross focal neurologic deficits are appreciated. No gait instability. Skin:  Skin is warm, dry and intact.  Psychiatric: Mood and affect are normal. Speech and behavior are normal.  ____________________________________________   LABS (all labs ordered are  listed, but only abnormal results are displayed)  Labs Reviewed - No data to display  PROCEDURES  Procedure(s) performed: None  Procedures  Critical Care performed: No  ____________________________________________   INITIAL IMPRESSION / ASSESSMENT AND PLAN / ED COURSE  As part of my medical decision making, I reviewed the following data within the electronic MEDICAL RECORD NUMBER Notes from prior ED visits and [place] Controlled Substance Database  She presents to the emergency department with complaint of dental pain.  Patient states that she was on amoxicillin the last time and did get some diarrhea with this.  Patient was given a prescription for Pen-Vee K 500 mg 4 times daily for 7 days and ibuprofen 600 mg every 8 hours with food.  Patient was given list of dental clinics to follow-up with your a dentist of her choice.  She is aware that this will continue to recur if she does not have dental care.  ____________________________________________   FINAL CLINICAL IMPRESSION(S) / ED DIAGNOSES  Final diagnoses:  Pain due to dental caries     ED Discharge Orders         Ordered    penicillin v potassium (VEETID) 500 MG tablet  4 times daily     10/14/17 0740    ibuprofen (ADVIL,MOTRIN) 600 MG tablet  Every 8 hours PRN     10/14/17 0740           Note:  This document was prepared using Dragon voice recognition software and may include unintentional dictation errors.    Johnn Hai, PA-C 10/14/17 1525    Earleen Newport, MD 10/15/17 [phone]

## 2017-10-14 NOTE — ED Triage Notes (Signed)
Pt reports tooth abscess and swelling to left bottom jaw for a while. Pt was seen here for it previously it got better but has now came back.

## 2017-10-14 NOTE — ED Notes (Signed)
See triage note   Presents with left side jaw swelling  States she has a broken tooth to  Lower gum line  having increased pain this am

## 2017-10-14 NOTE — Discharge Instructions (Addendum)
Call and see 1 of the dental clinics listed on your discharge papers.  Also [facility] dental clinic has walk-in hours and an appointment is not needed.  Begin taking antibiotics as directed 4 times a day for the next 7 days.  Ibuprofen 600 mg every 8 hours with food as needed for discomfort.  You may also take Tylenol with this medication if needed for extra pain relief.   OPTIONS FOR DENTAL FOLLOW UP CARE  [place] [facility] and [place] [employer] [phone])  [facility] [phone])  [place] [phone])  [place] [phone])  [facility] [phone]) This clinic caters to the indigent population and is on a lottery system. Location: [employer], [place], [place], [place] Clinic Hours: Wednesdays from 6pm - 9pm, patients seen by a lottery system. For dates, call or go to [email or web address] Services: Cleanings, fillings and simple extractions. Payment Options: DENTAL WORK IS FREE OF CHARGE. Bring proof of income or support. Best way to get seen: Arrive at 5:15 pm - this is a lottery, NOT first come/first serve, so arriving earlier will not increase your chances of being seen.     [facility] [phone] Select option 1 for emergencies   Location: [facility], [place], [address] [place] [facility] Hours: No walk-ins accepted - call the day before to schedule an appointment. Check in times are 9:30 am and 1:30 pm. Services: Simple extractions, temporary fillings, pulpectomy/pulp debridement, uncomplicated abscess drainage. Payment Options: PAYMENT IS DUE AT THE TIME OF SERVICE.  Fee is usually $100-200, additional surgical procedures (e.g. abscess drainage) may be  extra. Cash, checks, Visa/MasterCard accepted.  Can file Medicaid if patient is covered for dental - patient should call case worker to check. No discount for [facility] patients. Best way to get seen: MUST call the day before and get onto the schedule. Can usually be seen the next 1-2 days. No walk-ins accepted.     [place] [phone]   Location: [place], [place] [facility] Hours: M, W, Th, F 8am or 1:30pm, Tues 9a or 1:30 - first come/first served. Services: Simple extractions, temporary fillings, uncomplicated abscess drainage.  You do not need to be an [facility] resident. Payment Options: PAYMENT IS DUE AT THE TIME OF SERVICE. Dental insurance, otherwise sliding scale - bring proof of income or support. Depending on income and treatment needed, cost is usually $50-200. Best way to get seen: Arrive early as it is first come/first served.     [facility] [phone]   Location: [place] [facility] Hours: Mon-Thu 8a-5p Services: Most basic dental services including extractions and fillings. Payment Options: PAYMENT IS DUE AT THE TIME OF SERVICE. Sliding scale, up to 50% off - bring proof if income or support. Medicaid with dental option accepted. Best way to get seen: Call to schedule an appointment, can usually be seen within 2 weeks OR they will try to see walk-ins - show up at [facility] or 2p (you may have to wait).     [facility] RESIDENTS ONLY   Location: [facility], [place] [address], [place], [place] [address] Clinic Hours: By appointment only. Monday - Thursday 8am-5pm, Friday 8am-12pm Services: Cleanings, fillings, extractions. Payment Options: PAYMENT IS DUE  AT THE TIME OF SERVICE. Cash, Visa or MasterCard. Sliding scale - $30 minimum per service. Best way to get seen: Come in to office,  complete packet and make an appointment - need proof of income or support monies for each household member and proof of [facility] residence. Usually takes about a month to get in.     [facility] [phone]   Location: [address] [place] [facility] Hours: Walk-in Urgent Care Dental Services are offered Monday-Friday mornings only. The numbers of emergencies accepted daily is limited to the number of providers available. Maximum 15 - Mondays, Wednesdays & Thursdays Maximum 10 - Tuesdays & Fridays Services: You do not need to be a [facility] resident to be seen for a dental emergency. Emergencies are defined as pain, swelling, abnormal bleeding, or dental trauma. Walkins will receive x-rays if needed. NOTE: Dental cleaning is not an emergency. Payment Options: PAYMENT IS DUE AT THE TIME OF SERVICE. Minimum co-pay is $40.00 for uninsured patients. Minimum co-pay is $3.00 for Medicaid with dental coverage. Dental Insurance is accepted and must be presented at time of visit. Medicare does not cover dental. Forms of payment: Cash, credit card, checks. Best way to get seen: If not previously registered with the clinic, walk-in dental registration begins at 7:15 am and is on a first come/first serve basis. If previously registered with the clinic, call to make an appointment.     [facility] [place] ONLY   Location: [address] [place], [place] Clinic Hours: Mon-Thu 10a-2p Services: Extractions only! Payment Options: FREE (donations accepted) - bring proof of income or support Best way to get seen: Call and schedule an appointment OR come at 8am on the 1st Monday of every month (except for holidays) when it is first come/first served.     [facility] [phone]   Location: [place], [place] Clinic Hours: Friday mornings Services, Payment Options, Best way to get seen: Call for  info

## 2018-05-16 ENCOUNTER — Emergency Department
Admission: EM | Admit: 2018-05-16 | Discharge: 2018-05-16 | Disposition: A | Discharge status: Home / Self Care | Payer: Self-pay | Attending: Emergency Medicine | Admitting: Emergency Medicine

## 2018-05-16 ENCOUNTER — Encounter: Payer: Self-pay | Admitting: Emergency Medicine

## 2018-05-16 ENCOUNTER — Other Ambulatory Visit: Payer: Self-pay

## 2018-05-16 DIAGNOSIS — K0889 Other specified disorders of teeth and supporting structures: Secondary | ICD-10-CM

## 2018-05-16 DIAGNOSIS — J4521 Mild intermittent asthma with (acute) exacerbation: Secondary | ICD-10-CM | POA: Insufficient documentation

## 2018-05-16 DIAGNOSIS — F17210 Nicotine dependence, cigarettes, uncomplicated: Secondary | ICD-10-CM | POA: Insufficient documentation

## 2018-05-16 DIAGNOSIS — K047 Periapical abscess without sinus: Secondary | ICD-10-CM | POA: Insufficient documentation

## 2018-05-16 MED ORDER — PREDNISONE 20 MG PO TABS
60.0000 mg | ORAL_TABLET | Freq: Once | ORAL | Status: AC
  Administered 2018-05-16: 60 mg via ORAL
  Filled 2018-05-16: qty 3

## 2018-05-16 MED ORDER — PREDNISONE 20 MG PO TABS: ORAL_TABLET | ORAL | 0 refills | Status: DC

## 2018-05-16 MED ORDER — IBUPROFEN 600 MG PO TABS: 600.0000 mg | ORAL_TABLET | Freq: Three times a day (TID) | ORAL | 0 refills | Status: DC | PRN

## 2018-05-16 MED ORDER — HYDROCODONE-ACETAMINOPHEN 5-325 MG PO TABS: 1.0000 | ORAL_TABLET | Freq: Four times a day (QID) | ORAL | 0 refills | Status: DC | PRN

## 2018-05-16 MED ORDER — AMOXICILLIN 500 MG PO CAPS: 500.0000 mg | ORAL_CAPSULE | Freq: Three times a day (TID) | ORAL | 0 refills | Status: DC

## 2018-05-16 MED ORDER — HYDROCODONE-ACETAMINOPHEN 5-325 MG PO TABS
1.0000 | ORAL_TABLET | Freq: Once | ORAL | Status: AC
  Administered 2018-05-16: 1 via ORAL
  Filled 2018-05-16: qty 1

## 2018-05-16 MED ORDER — AMOXICILLIN 500 MG PO CAPS
500.0000 mg | ORAL_CAPSULE | Freq: Once | ORAL | Status: AC
  Administered 2018-05-16: 500 mg via ORAL
  Filled 2018-05-16: qty 1

## 2018-05-16 MED ORDER — IBUPROFEN 600 MG PO TABS
600.0000 mg | ORAL_TABLET | Freq: Once | ORAL | Status: AC
  Administered 2018-05-16: 600 mg via ORAL
  Filled 2018-05-16: qty 1

## 2018-05-16 MED ORDER — AMOXICILLIN 500 MG PO CAPS: 500.0000 mg | ORAL_CAPSULE | Freq: Once | ORAL | Status: DC

## 2018-05-16 NOTE — ED Triage Notes (Signed)
Pt reports toothache for 2 days, reports also persistent cough. Reports has asthma and has been using her inhaler. Pt denies any fever at home, pt talks in complete sentences no distress noted

## 2018-05-16 NOTE — ED Provider Notes (Signed)
[facility] Emergency Department Provider Note   ____________________________________________   First MD Initiated Contact with Patient 05/16/18 [phone]     (approximate)  I have reviewed the triage vital signs and the nursing notes.   HISTORY  Chief Complaint Cough and Dental Pain    HPI Abigail Martinez is a 43 y.o. female who presents to the ED from home with a chief complaint of dental pain and cough/asthma exacerbation.  Patient has a previously chipped left lower molar.  Area has been having increased pain for the past 2 days.  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  Patient does have a history of asthma and has been using her inhaler.  Denies associated fever, chills, chest pain, shortness of breath, abdominal pain, nausea, vomiting or diarrhea.  Denies recent travel, trauma or exposures to persons diagnosed with coronavirus.       Past Medical History:  Diagnosis Date  . Asthma   . Cancer (HCC)    Uterine  . Hepatitis B   . Migraines   . Seizures [facility])     Patient Active Problem List   Diagnosis Date Noted  . Heart murmur 07/26/2014  . Epigastric abdominal pain 07/12/2014  . Tobacco abuse 07/12/2014    Past Surgical History:  Procedure Laterality Date  . CESAREAN SECTION    . CHOLECYSTECTOMY    . DILATION AND CURETTAGE OF UTERUS    . HERNIA REPAIR    . TUBAL LIGATION      Prior to Admission medications   Medication Sig Start Date End Date Taking? Authorizing Provider  albuterol (PROVENTIL HFA;VENTOLIN HFA) 108 (90 Base) MCG/ACT inhaler Inhale 2 puffs into the lungs every 4 (four) hours as needed for wheezing or shortness of breath. 06/27/16   Triplett, Johnette Abraham B, FNP  HYDROcodone-acetaminophen (NORCO) 5-325 MG tablet Take 1 tablet by mouth every 6 (six) hours as needed for moderate pain. 05/16/18   Paulette Blanch, MD  ibuprofen (ADVIL,MOTRIN) 600 MG tablet Take 1 tablet (600 mg  total) by mouth every 8 (eight) hours as needed. 05/16/18   Paulette Blanch, MD  penicillin v potassium (VEETID) 500 MG tablet Take 1 tablet (500 mg total) by mouth 4 (four) times daily. 10/14/17   Johnn Hai, PA-C  predniSONE (DELTASONE) 20 MG tablet 3 tablets PO qd x 4 days 05/16/18   Paulette Blanch, MD    Allergies Strawberry extract  Family History  Problem Relation Age of Onset  . Cancer Mother     Social History Social History   Tobacco Use  . Smoking status: Current Every Day Smoker    Packs/day: 0.50    Types: Cigarettes  . Smokeless tobacco: Never Used  Substance Use Topics  . Alcohol use: Yes    Alcohol/week: 2.0 standard drinks    Types: 2 Cans of beer per week  . Drug use: No    Review of Systems  Constitutional: No fever/chills Eyes: No visual changes. ENT: Positive for dental pain.  No sore throat. Cardiovascular: Denies chest pain. Respiratory: Positive for dry cough and wheezing.  Denies shortness of breath. Gastrointestinal: No abdominal pain.  No nausea, no vomiting.  No diarrhea.  No constipation. Genitourinary: Negative for dysuria. Musculoskeletal: Negative for back pain. Skin: Negative for rash. Neurological: Negative for headaches, focal weakness or numbness.   ____________________________________________   PHYSICAL EXAM:  VITAL SIGNS: ED Triage Vitals [05/16/18 0613]  Enc Vitals Group     BP 126/78     Pulse Rate 76     Resp 20     Temp 98 F (36.7 C)     Temp Source Oral     SpO2 98 %     Weight 120 lb (54.4 kg)     Height 5\' 1"  (1.549 m)     Head Circumference      Peak Flow      Pain Score 8     Pain Loc      Pain Edu?      Excl. in [place]?     Constitutional: Alert and oriented. Well appearing and in no acute distress. Eyes: Conjunctivae are normal. PERRL. EOMI. Head: Atraumatic. Nose: No congestion/rhinnorhea. Mouth/Throat: Mucous membranes are moist.  Left lower posterior molar with previously cracked tooth which is  tender to palpation with tongue blade.  No intraoral or extraoral swelling.. Neck: No stridor.   Cardiovascular: Normal rate, regular rhythm. Grossly normal heart sounds.  Good peripheral circulation. Respiratory: Normal respiratory effort.  No retractions. Lungs CTAB.  No wheezing. Gastrointestinal: Soft and nontender. No distention. No abdominal bruits. No CVA tenderness. Musculoskeletal: No lower extremity tenderness nor edema.  No joint effusions. Neurologic:  Normal speech and language. No gross focal neurologic deficits are appreciated. No gait instability. Skin:  Skin is warm, dry and intact. No rash noted. Psychiatric: Mood and affect are normal. Speech and behavior are normal.  ____________________________________________   LABS (all labs ordered are listed, but only abnormal results are displayed)  Labs Reviewed - No data to display ____________________________________________  EKG  None ____________________________________________  RADIOLOGY  ED MD interpretation: None  Official radiology report(s): No results found.  ____________________________________________   PROCEDURES  Procedure(s) performed (including Critical Care):  Procedures   ____________________________________________   INITIAL IMPRESSION / ASSESSMENT AND PLAN / ED COURSE  As part of my medical decision making, I reviewed the following data within the [place] notes reviewed and incorporated, Old chart reviewed and Notes from prior ED visits        43 year old female who presents with mild asthma exacerbation and dental pain.  Will start 5-day prednisone burst, amoxicillin, NSAIDs and analgesia for dental pain.  Strict return precautions given.  Patient verbalizes understanding agrees with plan of care.      ____________________________________________   FINAL CLINICAL IMPRESSION(S) / ED DIAGNOSES  Final diagnoses:  Pain, dental  Dental abscess  Mild  intermittent asthma with exacerbation     ED Discharge Orders         Ordered    predniSONE (DELTASONE) 20 MG tablet     05/16/18 0628    ibuprofen (ADVIL,MOTRIN) 600 MG tablet  Every 8 hours PRN     05/16/18 0628    HYDROcodone-acetaminophen (NORCO) 5-325 MG tablet  Every 6 hours PRN     05/16/18 7169           Note:  This document was prepared using Dragon voice recognition software and may include unintentional dictation errors.   Paulette Blanch, MD 05/16/18 [phone]

## 2018-05-16 NOTE — ED Notes (Signed)
the patient left prior to receiving dental clinic handout  No peripheral IV placed this visit.   Discharge instructions reviewed with patient. Questions fielded by this RN. Patient verbalizes understanding of instructions. Patient discharged home in stable condition per sung. No acute distress noted at time of discharge.

## 2018-05-16 NOTE — Discharge Instructions (Addendum)
1.  Take antibiotic as prescribed (amoxicillin 500 mg 3 times daily x7 days). 2.  You may take pain medicines as needed (Motrin/Norco #15). 3.  Take Prednisone daily x4 days.  Start your next dose Monday morning. 4.  Return to the ER for worsening symptoms, persistent vomiting, difficulty breathing or other concerns.

## 2018-07-06 ENCOUNTER — Emergency Department
Admission: EM | Admit: 2018-07-06 | Discharge: 2018-07-06 | Disposition: A | Discharge status: Home / Self Care | Payer: Self-pay | Attending: Emergency Medicine | Admitting: Emergency Medicine

## 2018-07-06 ENCOUNTER — Other Ambulatory Visit: Payer: Self-pay

## 2018-07-06 ENCOUNTER — Encounter: Payer: Self-pay | Admitting: *Deleted

## 2018-07-06 DIAGNOSIS — S025XXA Fracture of tooth (traumatic), initial encounter for closed fracture: Secondary | ICD-10-CM | POA: Insufficient documentation

## 2018-07-06 DIAGNOSIS — Y929 Unspecified place or not applicable: Secondary | ICD-10-CM | POA: Insufficient documentation

## 2018-07-06 DIAGNOSIS — F17210 Nicotine dependence, cigarettes, uncomplicated: Secondary | ICD-10-CM | POA: Insufficient documentation

## 2018-07-06 DIAGNOSIS — X58XXXA Exposure to other specified factors, initial encounter: Secondary | ICD-10-CM | POA: Insufficient documentation

## 2018-07-06 DIAGNOSIS — Z79899 Other long term (current) drug therapy: Secondary | ICD-10-CM | POA: Insufficient documentation

## 2018-07-06 DIAGNOSIS — Y999 Unspecified external cause status: Secondary | ICD-10-CM | POA: Insufficient documentation

## 2018-07-06 DIAGNOSIS — K0889 Other specified disorders of teeth and supporting structures: Secondary | ICD-10-CM | POA: Insufficient documentation

## 2018-07-06 DIAGNOSIS — K047 Periapical abscess without sinus: Secondary | ICD-10-CM

## 2018-07-06 DIAGNOSIS — Y939 Activity, unspecified: Secondary | ICD-10-CM | POA: Insufficient documentation

## 2018-07-06 MED ORDER — MAGIC MOUTHWASH W/LIDOCAINE: 5.0000 mL | Freq: Four times a day (QID) | ORAL | 0 refills | Status: DC

## 2018-07-06 MED ORDER — HYDROCODONE-ACETAMINOPHEN 5-325 MG PO TABS: 1.0000 | ORAL_TABLET | ORAL | 0 refills | Status: DC | PRN

## 2018-07-06 MED ORDER — AMOXICILLIN 875 MG PO TABS: 875.0000 mg | ORAL_TABLET | Freq: Two times a day (BID) | ORAL | 0 refills | Status: DC

## 2018-07-06 MED ORDER — HYDROCODONE-ACETAMINOPHEN 5-325 MG PO TABS
1.0000 | ORAL_TABLET | Freq: Once | ORAL | Status: AC
  Administered 2018-07-06: 1 via ORAL
  Filled 2018-07-06: qty 1

## 2018-07-06 NOTE — ED Triage Notes (Signed)
Pt has left lower toothache for 4 days.  Sx for 4 days.  Pt alert

## 2018-07-06 NOTE — ED Notes (Signed)
See triage note  Presents with pain and swelling to left lower gum line  States she has a broken tooth and is waiting to see oral surgeon   Pain became worse about 3-4 days ago

## 2018-07-06 NOTE — Discharge Instructions (Signed)
OPTIONS FOR DENTAL FOLLOW UP CARE ° °[place] Department of Health and Human Services - Local Safety Net Dental Clinics °http://www.ncdhhs.gov/dph/oralhealth/services/safetynetclinics.htm °  °[facility] ([phone]) ° °[facility] ([phone]) ° °[facility] ([phone]) ° °[facility] ([phone]) ° °[facility] ([phone]) °This clinic caters to the indigent population and is on a lottery system. °Location: °[facility], [place], [address], [place] °Clinic Hours: °Wednesdays from 6pm - 9pm, patients seen by a lottery system. °For dates, call or go to [email or web address].[facility].edu/[facility]/patients/Dental-[facility] °Services: °Cleanings, fillings and simple extractions. °Payment Options: °DENTAL WORK IS FREE OF CHARGE. Bring proof of income or support. °Best way to get seen: °Arrive at 5:15 pm - this is a lottery, NOT first come/first serve, so arriving earlier will not increase your chances of being seen. °  °  °[facility] °[phone] °Select option 1 for emergencies °  °Location: °[facility] [place], [address], [place] °Clinic Hours: °No walk-ins accepted - call the day before to schedule an appointment. °Check in times are 9:30 am and 1:30 pm. °Services: °Simple extractions, temporary fillings, pulpectomy/pulp debridement, uncomplicated abscess drainage. °Payment Options: °PAYMENT IS DUE AT THE TIME OF SERVICE.  Fee is usually $100-200, additional surgical procedures (e.g. abscess drainage) may be extra. °Cash, checks, Visa/MasterCard accepted.  Can file Medicaid if patient is covered for dental - patient should call case worker to check. °No discount for [facility] patients. °Best way to get seen: °MUST call the day before and get onto the schedule. Can usually be seen the next 1-2 days. No walk-ins accepted. °  °  °[facility] °[phone] °   °Location: °[facility], [address], [place] °Clinic Hours: °M, W, Th, F 8am or 1:30pm, Tues 9a or 1:30 - first come/first served. °Services: °Simple extractions, temporary fillings, uncomplicated abscess drainage.  You do not need to be an [place] resident. °Payment Options: °PAYMENT IS DUE AT THE TIME OF SERVICE. °Dental insurance, otherwise sliding scale - bring proof of income or support. °Depending on income and treatment needed, cost is usually $50-200. °Best way to get seen: °Arrive early as it is first come/first served. °  °  °[facility] °[phone] °  °Location: °[address] °Clinic Hours: °Mon-Thu 8a-5p °Services: °Most basic dental services including extractions and fillings. °Payment Options: °PAYMENT IS DUE AT THE TIME OF SERVICE. °Sliding scale, up to 50% off - bring proof if income or support. °Medicaid with dental option accepted. °Best way to get seen: °Call to schedule an appointment, can usually be seen within 2 weeks OR they will try to see walk-ins - show up at 8a or 2p (you may have to wait). °  °  °[facility] °[phone] °[place] RESIDENTS ONLY °  °Location: °[facility], [address], [place], [place] [address] °Clinic Hours: By appointment only. °Monday - Thursday 8am-5pm, Friday 8am-12pm °Services: Cleanings, fillings, extractions. °Payment Options: °PAYMENT IS DUE AT THE TIME OF SERVICE. °Cash, Visa or MasterCard. Sliding scale - $30 minimum per service. °Best way to get seen: °Come in to office, complete packet and make an appointment - need proof of income °or support monies for each household member and proof of [place] residence. °Usually takes about a month to get in. °  °  °[facility] °[phone] °  °Location: °[address]   [place] °Clinic Hours: Walk-in Urgent Care Dental Services are offered Monday-Friday  mornings only. °The numbers of emergencies accepted daily is limited to the number of °providers available. °Maximum 15 - Mondays, Wednesdays & Thursdays °Maximum 10 - Tuesdays & Fridays °Services: °You do not need to be a [place] resident to be seen for a dental emergency. °Emergencies are defined as pain, swelling, abnormal bleeding, or dental trauma. Walkins will receive x-rays if needed. °NOTE: Dental cleaning is not an emergency. °Payment Options: °PAYMENT IS DUE AT THE TIME OF SERVICE. °Minimum co-pay is $40.00 for uninsured patients. °Minimum co-pay is $3.00 for Medicaid with dental coverage. °Dental Insurance is accepted and must be presented at time of visit. °Medicare does not cover dental. °Forms of payment: Cash, credit card, checks. °Best way to get seen: °If not previously registered with the clinic, walk-in dental registration begins at 7:15 am and is on a first come/first serve basis. °If previously registered with the clinic, call to make an appointment. °  °  °[facility] °[phone] °[place] RESIDENTS ONLY °  °Location: °[address], [place], [place] °Clinic Hours: °Mon-Thu 10a-2p °Services: Extractions only! °Payment Options: °FREE (donations accepted) - bring proof of income or support °Best way to get seen: °Call and schedule an appointment OR come at 8am on the 1st Monday of every month (except for holidays) when it is first come/first served. °  °  °[facility] °[phone] °  °Location: °[address], [place] °Clinic Hours: °Friday mornings °Services, Payment Options, Best way to get seen: °Call for info °

## 2018-07-06 NOTE — ED Provider Notes (Signed)
[facility] Emergency Department Provider Note  ____________________________________________  Time seen: Approximately 5:50 PM  I have reviewed the triage vital signs and the nursing notes.   HISTORY  Chief Complaint Dental Pain    HPI Abigail Martinez is a 43 y.o. female who presents the emergency department complaining of left lower dental pain.  Patient reports that she has a broken molar on the left lower dentition.  Patient reports that she has been seen here in the emergency department for same, had some medication that helped until she could see dentist.  Patient reports that the dentist did not want to extract the tooth and recommended that she follow-up with an oral surgeon.  Patient reports that she does not have insurance and has not been able to follow-up with dental surgery.  Patient denies any fevers or chills, difficulty breathing or swallowing.  Only complaint at this time is left dental pain.         Past Medical History:  Diagnosis Date  . Asthma   . Cancer (HCC)    Uterine  . Hepatitis B   . Migraines   . Seizures [facility])     Patient Active Problem List   Diagnosis Date Noted  . Heart murmur 07/26/2014  . Epigastric abdominal pain 07/12/2014  . Tobacco abuse 07/12/2014    Past Surgical History:  Procedure Laterality Date  . CESAREAN SECTION    . CHOLECYSTECTOMY    . DILATION AND CURETTAGE OF UTERUS    . HERNIA REPAIR    . TUBAL LIGATION      Prior to Admission medications   Medication Sig Start Date End Date Taking? Authorizing Provider  albuterol (PROVENTIL HFA;VENTOLIN HFA) 108 (90 Base) MCG/ACT inhaler Inhale 2 puffs into the lungs every 4 (four) hours as needed for wheezing or shortness of breath. 06/27/16   Victorino Dike, FNP    Allergies Strawberry extract  Family History  Problem Relation Age of Onset  . Cancer Mother     Social History Social History   Tobacco Use  . Smoking status: Current Every Day  Smoker    Packs/day: 0.50    Types: Cigarettes  . Smokeless tobacco: Never Used  Substance Use Topics  . Alcohol use: Not Currently    Alcohol/week: 2.0 standard drinks    Types: 2 Cans of beer per week  . Drug use: No     Review of Systems  Constitutional: No fever/chills Eyes: No visual changes. No discharge ENT: Left lower dental pain. Cardiovascular: no chest pain. Respiratory: no cough. No SOB. Gastrointestinal: No abdominal pain.  No nausea, no vomiting.  Musculoskeletal: Negative for musculoskeletal pain. Skin: Negative for rash, abrasions, lacerations, ecchymosis. Neurological: Negative for headaches, focal weakness or numbness. 10-point ROS otherwise negative.  ____________________________________________   PHYSICAL EXAM:  VITAL SIGNS: ED Triage Vitals  Enc Vitals Group     BP 07/06/18 1741 (!) 152/81     Pulse Rate 07/06/18 1741 82     Resp 07/06/18 1741 18     Temp 07/06/18 1741 98.4 F (36.9 C)     Temp Source 07/06/18 1741 Oral     SpO2 07/06/18 1741 100 %     Weight 07/06/18 1739 121 lb (54.9 kg)     Height 07/06/18 1739 5\' 1"  (1.549 m)     Head Circumference --      Peak Flow --      Pain Score 07/06/18 1739 8     Pain Loc --  Pain Edu? --      Excl. in [place]? --      Constitutional: Alert and oriented. Well appearing and in no acute distress. Eyes: Conjunctivae are normal. PERRL. EOMI. Head: Atraumatic. ENT:      Ears:       Nose: No congestion/rhinnorhea.      Mouth/Throat: Mucous membranes are moist.  Multiple caries, dental erosions, missing dentition identified throughout dentition.  Tooth of concern is tooth #23.  Has mild erythema surrounding.  No gross edema.  No purulent drainage.  No fluctuance with depression on tongue depressor.  Area is tender to palpation along the external jaw but no palpable abnormality.  Otherwise, no acute signs of infection in the oropharynx. Neck: No stridor.  Hematological/Lymphatic/Immunilogical: No  cervical lymphadenopathy. Cardiovascular: Normal rate, regular rhythm. Normal S1 and S2.  Good peripheral circulation. Respiratory: Normal respiratory effort without tachypnea or retractions. Lungs CTAB. Good air entry to the bases with no decreased or absent breath sounds. Musculoskeletal: Full range of motion to all extremities. No gross deformities appreciated. Neurologic:  Normal speech and language. No gross focal neurologic deficits are appreciated.  Skin:  Skin is warm, dry and intact. No rash noted. Psychiatric: Mood and affect are normal. Speech and behavior are normal. Patient exhibits appropriate insight and judgement.   ____________________________________________   LABS (all labs ordered are listed, but only abnormal results are displayed)  Labs Reviewed - No data to display ____________________________________________  EKG   ____________________________________________  RADIOLOGY   No results found.  ____________________________________________    PROCEDURES  Procedure(s) performed:    Procedures    Medications - No data to display   ____________________________________________   INITIAL IMPRESSION / ASSESSMENT AND PLAN / ED COURSE  Pertinent labs & imaging results that were available during my care of the patient were reviewed by me and considered in my medical decision making (see chart for details).  Review of the [place] CSRS was performed in accordance of the [place] prior to dispensing any controlled drugs.           Patient's diagnosis is consistent with broken dentition, mild dental infection.  Patient presents emergency department with left lower dental pain.  On exam, patient does have broken dentition with mild surrounding erythema.  No indication of abscess.  Patient is given dental follow-up options.  Patient will be placed on antibiotics, Magic mouthwash, limited prescription of Vicodin for pain.  Follow-up with dental surgery..  Patient is  given ED precautions to return to the ED for any worsening or new symptoms.     ____________________________________________  FINAL CLINICAL IMPRESSION(S) / ED DIAGNOSES  Final diagnoses:  Dental infection  Closed fracture of tooth, initial encounter      NEW MEDICATIONS STARTED DURING THIS VISIT:  ED Discharge Orders    None          This chart was dictated using voice recognition software/Dragon. Despite best efforts to proofread, errors can occur which can change the meaning. Any change was purely unintentional.    Darletta Moll, PA-C 07/06/18 1910    Nena Polio, MD 07/07/18 [phone]

## 2018-10-12 ENCOUNTER — Other Ambulatory Visit: Payer: Self-pay

## 2018-10-12 ENCOUNTER — Encounter: Payer: Self-pay | Admitting: Medical Oncology

## 2018-10-12 ENCOUNTER — Emergency Department
Admission: EM | Admit: 2018-10-12 | Discharge: 2018-10-12 | Disposition: A | Discharge status: Home / Self Care | Payer: Self-pay | Attending: Emergency Medicine | Admitting: Emergency Medicine

## 2018-10-12 ENCOUNTER — Emergency Department: Payer: Self-pay

## 2018-10-12 DIAGNOSIS — K2900 Acute gastritis without bleeding: Secondary | ICD-10-CM

## 2018-10-12 DIAGNOSIS — K2970 Gastritis, unspecified, without bleeding: Secondary | ICD-10-CM | POA: Insufficient documentation

## 2018-10-12 DIAGNOSIS — J45909 Unspecified asthma, uncomplicated: Secondary | ICD-10-CM | POA: Insufficient documentation

## 2018-10-12 DIAGNOSIS — Z8541 Personal history of malignant neoplasm of cervix uteri: Secondary | ICD-10-CM | POA: Insufficient documentation

## 2018-10-12 DIAGNOSIS — F17210 Nicotine dependence, cigarettes, uncomplicated: Secondary | ICD-10-CM | POA: Insufficient documentation

## 2018-10-12 DIAGNOSIS — R110 Nausea: Secondary | ICD-10-CM

## 2018-10-12 LAB — COMPREHENSIVE METABOLIC PANEL
ALT: 18 U/L (ref 0–44)
AST: 32 U/L (ref 15–41)
Albumin: 4 g/dL (ref 3.5–5.0)
Alkaline Phosphatase: 42 U/L (ref 38–126)
Anion gap: 10 (ref 5–15)
BUN: 10 mg/dL (ref 6–20)
CO2: 22 mmol/L (ref 22–32)
Calcium: 8.8 mg/dL — ABNORMAL LOW (ref 8.9–10.3)
Chloride: 104 mmol/L (ref 98–111)
Creatinine, Ser: 0.59 mg/dL (ref 0.44–1.00)
GFR calc Af Amer: >60 mL/min (ref >60)
GFR calc non Af Amer: >60 mL/min (ref >60)
Glucose, Bld: 89 mg/dL (ref 70–99)
Potassium: 3.7 mmol/L (ref 3.5–5.1)
Sodium: 136 mmol/L (ref 135–145)
Total Bilirubin: 0.7 mg/dL (ref 0.3–1.2)
Total Protein: 7.1 g/dL (ref 6.5–8.1)

## 2018-10-12 LAB — CBC
HCT: 35.2 % — ABNORMAL LOW (ref 36.0–46.0)
Hemoglobin: 11.4 g/dL — ABNORMAL LOW (ref 12.0–15.0)
MCH: 24.9 pg — ABNORMAL LOW (ref 26.0–34.0)
MCHC: 32.4 g/dL (ref 30.0–36.0)
MCV: 76.9 fL — ABNORMAL LOW (ref 80.0–100.0)
Platelets: 275 10*3/uL (ref 150–400)
RBC: 4.58 MIL/uL (ref 3.87–5.11)
RDW: 18.6 % — ABNORMAL HIGH (ref 11.5–15.5)
WBC: 5.9 10*3/uL (ref 4.0–10.5)
nRBC: 0 % (ref 0.0–0.2)

## 2018-10-12 LAB — URINALYSIS, COMPLETE (UACMP) WITH MICROSCOPIC
Bacteria, UA: NONE SEEN
Bilirubin Urine: NEGATIVE
Glucose, UA: NEGATIVE mg/dL
Hgb urine dipstick: NEGATIVE
Ketones, ur: NEGATIVE mg/dL
Leukocytes,Ua: NEGATIVE
Nitrite: NEGATIVE
Protein, ur: NEGATIVE mg/dL
Specific Gravity, Urine: 1.008 (ref 1.005–1.030)
WBC, UA: NONE SEEN WBC/hpf (ref 0–5)
pH: 6 (ref 5.0–8.0)

## 2018-10-12 LAB — POCT PREGNANCY, URINE: Preg Test, Ur: NEGATIVE

## 2018-10-12 LAB — LIPASE, BLOOD: Lipase: 25 U/L (ref 11–51)

## 2018-10-12 MED ORDER — ONDANSETRON HCL 4 MG PO TABS: 4.0000 mg | ORAL_TABLET | Freq: Every day | ORAL | 0 refills | Status: AC | PRN

## 2018-10-12 MED ORDER — ALUM & MAG HYDROXIDE-SIMETH 200-200-20 MG/5ML PO SUSP
30.0000 mL | Freq: Once | ORAL | Status: AC
  Administered 2018-10-12: 30 mL via ORAL
  Filled 2018-10-12: qty 30

## 2018-10-12 MED ORDER — IOHEXOL 300 MG/ML  SOLN
75.0000 mL | Freq: Once | INTRAMUSCULAR | Status: AC | PRN
  Administered 2018-10-12: 75 mL via INTRAVENOUS

## 2018-10-12 MED ORDER — SUCRALFATE 1 G PO TABS: 1.0000 g | ORAL_TABLET | Freq: Four times a day (QID) | ORAL | 0 refills | Status: AC

## 2018-10-12 MED ORDER — SUCRALFATE 1 G PO TABS
1.0000 g | ORAL_TABLET | Freq: Once | ORAL | Status: AC
  Administered 2018-10-12: 1 g via ORAL
  Filled 2018-10-12: qty 1

## 2018-10-12 MED ORDER — PANTOPRAZOLE SODIUM 40 MG IV SOLR
40.0000 mg | Freq: Once | INTRAVENOUS | Status: AC
  Administered 2018-10-12: 40 mg via INTRAVENOUS
  Filled 2018-10-12: qty 40

## 2018-10-12 NOTE — ED Provider Notes (Signed)
[facility] Emergency Department Provider Note  ____________________________________________   First MD Initiated Contact with Patient 10/12/18 1006     (approximate)  I have reviewed the triage vital signs and the nursing notes.   HISTORY  Chief Complaint Abdominal Pain    HPI Abigail Martinez is a 43 y.o. female otherwise healthy who presents with abdominal pain.  Patient endorses pain in her epigastric region that is been going on for a year.  However  the past 1 week it has gotten worse in nature.  She has been taking omeprazole and Tums without relief in her symptoms.  She is now having severe pain that is constant, worse after eating, nothing makes it better. The pain is epigastric in nature.  She has lost 40 pounds in the past 3 months.  She does drink alcohol, 7 beers daily as well as smoke cigarettes.  She has associated vomiting that is nonbloody nonbilious.  She denies any fevers.  She has had a prior cholecystectomy and hernia repair. No SOB, chest pain.           Past Medical History:  Diagnosis Date  . Asthma   . Cancer (HCC)    Uterine  . Hepatitis B   . Migraines   . Seizures [facility])     Patient Active Problem List   Diagnosis Date Noted  . Heart murmur 07/26/2014  . Epigastric abdominal pain 07/12/2014  . Tobacco abuse 07/12/2014    Past Surgical History:  Procedure Laterality Date  . CESAREAN SECTION    . CHOLECYSTECTOMY    . DILATION AND CURETTAGE OF UTERUS    . HERNIA REPAIR    . TUBAL LIGATION      Prior to Admission medications   Medication Sig Start Date End Date Taking? Authorizing Provider  albuterol (PROVENTIL HFA;VENTOLIN HFA) 108 (90 Base) MCG/ACT inhaler Inhale 2 puffs into the lungs every 4 (four) hours as needed for wheezing or shortness of breath. 06/27/16   Triplett, Cari B, FNP  amoxicillin (AMOXIL) 875 MG tablet Take 1 tablet (875 mg total) by mouth 2 (two) times daily. 07/06/18   Cuthriell, Charline Bills, PA-C   HYDROcodone-acetaminophen (NORCO/VICODIN) 5-325 MG tablet Take 1 tablet by mouth every 4 (four) hours as needed for moderate pain. 07/06/18   Cuthriell, Charline Bills, PA-C  magic mouthwash w/lidocaine SOLN Take 5 mLs by mouth 4 (four) times daily. 07/06/18   Cuthriell, Charline Bills, PA-C    Allergies Strawberry extract  Family History  Problem Relation Age of Onset  . Cancer Mother     Social History Social History   Tobacco Use  . Smoking status: Current Every Day Smoker    Packs/day: 0.50    Types: Cigarettes  . Smokeless tobacco: Never Used  Substance Use Topics  . Alcohol use: Not Currently    Alcohol/week: 2.0 standard drinks    Types: 2 Cans of beer per week  . Drug use: No      Review of Systems Constitutional: No fever/chills, positive weight loss Eyes: No visual changes. ENT: No sore throat. Cardiovascular: Denies chest pain. Respiratory: Denies shortness of breath. Gastrointestinal: Positive abdominal pain, positive vomiting Genitourinary: Negative for dysuria. Musculoskeletal: Negative for back pain. Skin: Negative for rash. Neurological: Negative for headaches, focal weakness or numbness. All other ROS negative ____________________________________________   PHYSICAL EXAM:  VITAL SIGNS: ED Triage Vitals  Enc Vitals Group     BP 10/12/18 0940 139/89     Pulse Rate 10/12/18  0940 84     Resp 10/12/18 0940 18     Temp 10/12/18 0940 98.4 F (36.9 C)     Temp Source 10/12/18 0940 Oral     SpO2 10/12/18 0940 99 %     Weight 10/12/18 0938 113 lb (51.3 kg)     Height 10/12/18 0938 5\' 1"  (1.549 m)     Head Circumference --      Peak Flow --      Pain Score 10/12/18 0938 5     Pain Loc --      Pain Edu? --      Excl. in [place]? --     Constitutional: Alert and oriented. Well appearing and in no acute distress. Eyes: Conjunctivae are normal. EOMI. Head: Atraumatic. Nose: No congestion/rhinnorhea. Mouth/Throat: Mucous membranes are moist.   Neck: No  stridor. Trachea Midline. FROM Cardiovascular: Normal rate, regular rhythm. Grossly normal heart sounds.  Good peripheral circulation. Respiratory: Normal respiratory effort.  No retractions. Lungs CTAB. Gastrointestinal: Soft with epigastric tenderness.  No distention. No abdominal bruits.  Musculoskeletal: No lower extremity tenderness nor edema.  No joint effusions. Neurologic:  Normal speech and language. No gross focal neurologic deficits are appreciated.  Skin:  Skin is warm, dry and intact. No rash noted. Psychiatric: Mood and affect are normal. Speech and behavior are normal. GU: Deferred   ____________________________________________   LABS (all labs ordered are listed, but only abnormal results are displayed)  Labs Reviewed  COMPREHENSIVE METABOLIC PANEL - Abnormal; Notable for the following components:      Result Value   Calcium 8.8 (*)    All other components within normal limits  CBC - Abnormal; Notable for the following components:   Hemoglobin 11.4 (*)    HCT 35.2 (*)    MCV 76.9 (*)    MCH 24.9 (*)    RDW 18.6 (*)    All other components within normal limits  URINALYSIS, COMPLETE (UACMP) WITH MICROSCOPIC - Abnormal; Notable for the following components:   Color, Urine YELLOW (*)    APPearance CLEAR (*)    All other components within normal limits  LIPASE, BLOOD  POC URINE PREG, ED  POCT PREGNANCY, URINE   ____________________________________________   ED ECG REPORT I, Vanessa Ivan[place], the attending physician, personally viewed and interpreted this ECG.  EKG is normal sinus rate of 76, no ST elevation, no T wave inversion, normal intervals ____________________________________________  RADIOLOGY  Official radiology report(s): Ct Abdomen Pelvis W Contrast  Result Date: 10/12/2018 CLINICAL DATA:  Abdominal pain EXAM: CT ABDOMEN AND PELVIS WITH CONTRAST TECHNIQUE: Multidetector CT imaging of the abdomen and pelvis was performed using the standard protocol  following bolus administration of intravenous contrast. CONTRAST:  24mL OMNIPAQUE IOHEXOL 300 MG/ML  SOLN COMPARISON:  Jun 30, 2008 FINDINGS: Lower chest: Lung bases are clear. Hepatobiliary: No focal liver lesions are evident. The gallbladder is absent. There is no appreciable biliary duct dilatation. Pancreas: There is no pancreatic mass or inflammatory focus. Spleen: There are multiple calcified splenic granulomas. No focal splenic lesions are evident. Adrenals/Urinary Tract: Adrenals bilaterally appear unremarkable. Kidneys bilaterally show no evident mass or hydronephrosis on either side. There is no evident renal or ureteral calculus on either side. Urinary bladder is midline with wall thickness somewhat increased. Stomach/Bowel: There are multiple sigmoid and descending colonic diverticula without diverticulitis. There is no appreciable bowel wall or mesenteric thickening. No evident bowel obstruction. Terminal ileum appears normal. There is no evident free air or portal venous  air. Vascular/Lymphatic: There is no abdominal aortic aneurysm. There is slight aortic atherosclerosis. Major mesenteric arterial vessels appear patent. No adenopathy is evident in the abdomen or pelvis. Reproductive: Uterus is anteverted.  No evident pelvic mass. Other: Appendix appears normal. No evident abscess or ascites in the abdomen or pelvis. Musculoskeletal: No blastic or lytic bone lesions. No intramuscular or abdominal wall lesion evident. IMPRESSION: 1. There is thickening of the urinary bladder wall, a finding concerning for cystitis. No renal or ureteral calculus. No hydronephrosis on either side. 2. Multiple descending colonic and sigmoid diverticula without diverticulitis. No bowel obstruction. No abscess in the abdomen or pelvis. Appendix appears normal. 3.  Gallbladder absent. 4.  Calcified splenic granulomas again noted. Electronically Signed   By: Lowella Grip III M.D.   On: 10/12/2018 11:19     ____________________________________________   PROCEDURES  Procedure(s) performed (including Critical Care):  Procedures   ____________________________________________   INITIAL IMPRESSION / ASSESSMENT AND PLAN / ED COURSE  AERIONNA MORAVEK was evaluated in Emergency Department on 10/12/2018 for the symptoms described in the history of present illness. She was evaluated in the context of the global COVID-19 pandemic, which necessitated consideration that the patient might be at risk for infection with the SARS-CoV-2 virus that causes COVID-19. Institutional protocols and algorithms that pertain to the evaluation of patients at risk for COVID-19 are in a state of rapid change based on information released by regulatory bodies including the CDC and federal and state organizations. These policies and algorithms were followed during the patient's care in the ED.    We will get labs to evaluate for pancreatitis, retained stone.  Will get CT scan to evaluate for ulcer perforation versus SBO given her's multiple surgical history given her change and  increased pain and tenderness upon examination.  If CT is negative will have patient follow-up with GI given this is most likely secondary to ulcers versus gastritis from her smoking alcohol use.  Considered ACS but no pain in her chest, only risk factor is smoking and EKG without evidence of ischemia.   White count is normal no evidence of infection.  Hemoglobin is 11.4 which is slightly lower than baseline of 13 2 years ago.  She denies any melena or bright red blood in stool UA without evidence of UTI.  Pregnancy test negative.  CT scan without any acute concerns.   Reevaluated patient.  Patient is tolerating p.o.  Symptoms are improved with GI cocktail, Protonix.  Discussed with patient needing GI follow-up.  Given patient the number.  Instructed to have a bland diet given my high concern for ulcers versus gastritis as well as the importance of  cutting down on alcohol and smoking use in order to allow her stomach to heal.  Patient is already on a PPI.  Discussed return precautions with patient including bleeding, inability to take any p.o. or any other concerns.  I discussed the provisional nature of ED diagnosis, the treatment so far, the ongoing plan of care, follow up appointments and return precautions with the patient and any family or support people present. They expressed understanding and agreed with the plan, discharged home.    ____________________________________________   FINAL CLINICAL IMPRESSION(S) / ED DIAGNOSES   Final diagnoses:  Acute gastritis, presence of bleeding unspecified, unspecified gastritis type  Nausea      MEDICATIONS GIVEN DURING THIS VISIT:  Medications  alum & mag hydroxide-simeth (MAALOX/MYLANTA) 200-200-20 MG/5ML suspension 30 mL (30 mLs Oral Given 10/12/18 1042)  sucralfate (CARAFATE) tablet 1 g (1 g Oral Given 10/12/18 1042)  pantoprazole (PROTONIX) injection 40 mg (40 mg Intravenous Given 10/12/18 1043)  iohexol (OMNIPAQUE) 300 MG/ML solution 75 mL (75 mLs Intravenous Contrast Given 10/12/18 1102)     ED Discharge Orders         Ordered    ondansetron (ZOFRAN) 4 MG tablet  Daily PRN     10/12/18 1207    sucralfate (CARAFATE) 1 g tablet  4 times daily     10/12/18 1207           Note:  This document was prepared using Dragon voice recognition software and may include unintentional dictation errors.   Vanessa [place], MD 10/12/18 1210

## 2018-10-12 NOTE — Discharge Instructions (Addendum)
Your work-up here was reassuring.  Your blood levels were slightly lower than 2 years ago at 11.4.  This could be secondary to low iron but needs follow up with PCP.   Your CT scan did not show any acute pathology.  I am concerned you most likely have an ulcer versus gastritis.  You should call the GI doctor to make an appointment.  I have started you on Zofran to help with the nausea and sucralfate to help line the stomach.  It is important that you cut down on your alcohol and smoking use given this can flareup gastritis.  You should return to the ER with worsening pain or any other concerns.

## 2018-10-12 NOTE — ED Triage Notes (Signed)
Pt reports epigastric pain that began about 1 month ago, worsened 2 weeks ago. Pain worse after eating.

## 2020-01-10 ENCOUNTER — Emergency Department
Admission: EM | Admit: 2020-01-10 | Discharge: 2020-01-10 | Disposition: A | Discharge status: Home / Self Care | Payer: No Typology Code available for payment source | Attending: Emergency Medicine | Admitting: Emergency Medicine

## 2020-01-10 ENCOUNTER — Emergency Department: Payer: No Typology Code available for payment source

## 2020-01-10 DIAGNOSIS — G44309 Post-traumatic headache, unspecified, not intractable: Secondary | ICD-10-CM | POA: Diagnosis not present

## 2020-01-10 DIAGNOSIS — M25511 Pain in right shoulder: Secondary | ICD-10-CM | POA: Diagnosis not present

## 2020-01-10 DIAGNOSIS — M7918 Myalgia, other site: Secondary | ICD-10-CM

## 2020-01-10 DIAGNOSIS — F17210 Nicotine dependence, cigarettes, uncomplicated: Secondary | ICD-10-CM | POA: Diagnosis not present

## 2020-01-10 DIAGNOSIS — S8001XA Contusion of right knee, initial encounter: Secondary | ICD-10-CM | POA: Insufficient documentation

## 2020-01-10 DIAGNOSIS — Z79899 Other long term (current) drug therapy: Secondary | ICD-10-CM | POA: Insufficient documentation

## 2020-01-10 DIAGNOSIS — J45909 Unspecified asthma, uncomplicated: Secondary | ICD-10-CM | POA: Insufficient documentation

## 2020-01-10 DIAGNOSIS — Z8542 Personal history of malignant neoplasm of other parts of uterus: Secondary | ICD-10-CM | POA: Insufficient documentation

## 2020-01-10 DIAGNOSIS — M7912 Myalgia of auxiliary muscles, head and neck: Secondary | ICD-10-CM | POA: Insufficient documentation

## 2020-01-10 MED ORDER — CYCLOBENZAPRINE HCL 10 MG PO TABS
10.0000 mg | ORAL_TABLET | Freq: Once | ORAL | Status: AC
  Administered 2020-01-10: 10 mg via ORAL
  Filled 2020-01-10: qty 1

## 2020-01-10 MED ORDER — HYDROCODONE-ACETAMINOPHEN 5-325 MG PO TABS: 1.0000 | ORAL_TABLET | Freq: Three times a day (TID) | ORAL | 0 refills | Status: DC | PRN

## 2020-01-10 MED ORDER — ONDANSETRON 4 MG PO TBDP
4.0000 mg | ORAL_TABLET | Freq: Once | ORAL | Status: AC
  Administered 2020-01-10: 4 mg via ORAL
  Filled 2020-01-10: qty 1

## 2020-01-10 MED ORDER — IBUPROFEN 800 MG PO TABS: 800.0000 mg | ORAL_TABLET | Freq: Three times a day (TID) | ORAL | 0 refills | Status: DC | PRN

## 2020-01-10 MED ORDER — LIDOCAINE 5 % EX PTCH: 1.0000 | MEDICATED_PATCH | Freq: Two times a day (BID) | CUTANEOUS | 0 refills | Status: AC | PRN

## 2020-01-10 MED ORDER — CYCLOBENZAPRINE HCL 5 MG PO TABS: 5.0000 mg | ORAL_TABLET | Freq: Three times a day (TID) | ORAL | 0 refills | Status: DC | PRN

## 2020-01-10 MED ORDER — HYDROCODONE-ACETAMINOPHEN 5-325 MG PO TABS
1.0000 | ORAL_TABLET | Freq: Once | ORAL | Status: AC
  Administered 2020-01-10: 1 via ORAL
  Filled 2020-01-10: qty 1
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
## 2020-01-10 NOTE — Discharge Instructions (Signed)
Your exam, x-ray, and CT scan are all normal at this time.  There is no evidence of any fractures, dislocation, or serious head injury.  Can expect to feel sore and stiff for the next few days.  Prescription medications as provided.  Follow-up with your primary provider, a local urgent care, or return to this ED for worsening symptoms.
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
## 2020-01-12 ENCOUNTER — Emergency Department: Payer: No Typology Code available for payment source

## 2020-01-12 ENCOUNTER — Encounter: Payer: Self-pay | Admitting: Emergency Medicine

## 2020-01-12 ENCOUNTER — Emergency Department
Admission: EM | Admit: 2020-01-12 | Discharge: 2020-01-12 | Disposition: A | Discharge status: Home / Self Care | Payer: No Typology Code available for payment source | Attending: Emergency Medicine | Admitting: Emergency Medicine

## 2020-01-12 ENCOUNTER — Other Ambulatory Visit: Payer: Self-pay

## 2020-01-12 DIAGNOSIS — Z8542 Personal history of malignant neoplasm of other parts of uterus: Secondary | ICD-10-CM | POA: Diagnosis not present

## 2020-01-12 DIAGNOSIS — J45909 Unspecified asthma, uncomplicated: Secondary | ICD-10-CM | POA: Insufficient documentation

## 2020-01-12 DIAGNOSIS — M25531 Pain in right wrist: Secondary | ICD-10-CM | POA: Insufficient documentation

## 2020-01-12 DIAGNOSIS — Y92410 Unspecified street and highway as the place of occurrence of the external cause: Secondary | ICD-10-CM | POA: Diagnosis not present

## 2020-01-12 DIAGNOSIS — S161XXA Strain of muscle, fascia and tendon at neck level, initial encounter: Secondary | ICD-10-CM | POA: Diagnosis not present

## 2020-01-12 DIAGNOSIS — F17210 Nicotine dependence, cigarettes, uncomplicated: Secondary | ICD-10-CM | POA: Insufficient documentation

## 2020-01-12 DIAGNOSIS — S199XXA Unspecified injury of neck, initial encounter: Secondary | ICD-10-CM | POA: Diagnosis present

## 2020-01-12 MED ORDER — CYCLOBENZAPRINE HCL 10 MG PO TABS: 10.0000 mg | ORAL_TABLET | Freq: Three times a day (TID) | ORAL | 0 refills | Status: DC | PRN

## 2020-01-12 MED ORDER — KETOROLAC TROMETHAMINE 30 MG/ML IJ SOLN
15.0000 mg | Freq: Once | INTRAMUSCULAR | Status: DC
  Filled 2020-01-12: qty 1

## 2020-01-12 MED ORDER — KETOROLAC TROMETHAMINE 30 MG/ML IJ SOLN
15.0000 mg | Freq: Once | INTRAMUSCULAR | Status: AC
  Administered 2020-01-12: 15 mg via INTRAMUSCULAR
  Filled 2020-01-12: qty 1

## 2020-01-12 MED ORDER — HYDROCODONE-ACETAMINOPHEN 5-325 MG PO TABS
1.0000 | ORAL_TABLET | Freq: Four times a day (QID) | ORAL | 0 refills | Status: AC | PRN
Start: 2020-01-12 — End: 2020-01-15
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
## 2020-01-12 NOTE — Discharge Instructions (Addendum)
Wear your wrist brace for comfort.  Follow up with primary care or orthopedics for symptoms of concern.
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
## 2020-02-24 ENCOUNTER — Other Ambulatory Visit: Payer: Self-pay

## 2020-02-24 ENCOUNTER — Emergency Department: Payer: Self-pay

## 2020-02-24 ENCOUNTER — Emergency Department
Admission: EM | Admit: 2020-02-24 | Discharge: 2020-02-24 | Disposition: A | Discharge status: Home / Self Care | Payer: Self-pay | Attending: Student in an Organized Health Care Education/Training Program | Admitting: Student in an Organized Health Care Education/Training Program

## 2020-02-24 DIAGNOSIS — J45909 Unspecified asthma, uncomplicated: Secondary | ICD-10-CM | POA: Insufficient documentation

## 2020-02-24 DIAGNOSIS — J45901 Unspecified asthma with (acute) exacerbation: Secondary | ICD-10-CM | POA: Insufficient documentation

## 2020-02-24 DIAGNOSIS — J4521 Mild intermittent asthma with (acute) exacerbation: Secondary | ICD-10-CM

## 2020-02-24 DIAGNOSIS — J029 Acute pharyngitis, unspecified: Secondary | ICD-10-CM | POA: Insufficient documentation

## 2020-02-24 DIAGNOSIS — Z20822 Contact with and (suspected) exposure to covid-19: Secondary | ICD-10-CM | POA: Insufficient documentation

## 2020-02-24 DIAGNOSIS — Z8542 Personal history of malignant neoplasm of other parts of uterus: Secondary | ICD-10-CM | POA: Insufficient documentation

## 2020-02-24 DIAGNOSIS — F17210 Nicotine dependence, cigarettes, uncomplicated: Secondary | ICD-10-CM | POA: Insufficient documentation

## 2020-02-24 LAB — RESP PANEL BY RT-PCR (FLU A&B, COVID) ARPGX2
Influenza A by PCR: NEGATIVE
Influenza B by PCR: NEGATIVE
SARS Coronavirus 2 by RT PCR: NEGATIVE

## 2020-02-24 MED ORDER — PREDNISONE 20 MG PO TABS
60.0000 mg | ORAL_TABLET | Freq: Once | ORAL | Status: AC
  Administered 2020-02-24: 60 mg via ORAL
  Filled 2020-02-24: qty 3

## 2020-02-24 MED ORDER — ACETAMINOPHEN-CODEINE 120-12 MG/5ML PO SOLN
5.0000 mL | Freq: Once | ORAL | Status: AC
  Administered 2020-02-24: 5 mL via ORAL
  Filled 2020-02-24: qty 5

## 2020-02-24 MED ORDER — IPRATROPIUM-ALBUTEROL 0.5-2.5 (3) MG/3ML IN SOLN
3.0000 mL | Freq: Once | RESPIRATORY_TRACT | Status: AC
  Administered 2020-02-24: 3 mL via RESPIRATORY_TRACT
  Filled 2020-02-24: qty 3

## 2020-02-24 MED ORDER — GUAIFENESIN-CODEINE 100-10 MG/5ML PO SYRP
5.0000 mL | ORAL_SOLUTION | Freq: Three times a day (TID) | ORAL | 0 refills | Status: AC | PRN
Start: 2020-02-24 — End: 2020-02-26

## 2020-02-24 MED ORDER — PREDNISONE 10 MG PO TABS: ORAL_TABLET | ORAL | 0 refills | Status: DC

## 2020-02-24 NOTE — ED Notes (Signed)
See triage note. Pt denies fevers. Denies n/v. Reports diarrhea x2-3 days. Resp reg/unlabored currently. Cough present. Chest soreness from cough per pt.

## 2020-02-24 NOTE — ED Triage Notes (Signed)
FIRST NURSE NOTE:  Pt reports 3 days of sore throat, pt has hx of asthma, pt also reports coughing. C/o shortness of breath, no distress noted on arrival.

## 2020-02-24 NOTE — ED Triage Notes (Signed)
Pt presents via POV c/o cough and sore throat x3 days. Reports worsening SOB. 02 saturation 98% on room air. Pt in no acute distress in triage.

## 2020-02-24 NOTE — ED Provider Notes (Signed)
[facility] Emergency Department Provider Note  ____________________________________________  Time seen: Approximately 11:22 AM  I have reviewed the triage vital signs and the nursing notes.   HISTORY  Chief Complaint Sore Throat and Cough    HPI Abigail Martinez is a 44 y.o. female that presents to the emergency department for evaluation of sore throat, nonproductive cough for 3 days.  Patient states that she has been coughing so much that this morning, she had trouble taking a deep breath.  Cough is nonproductive.  Patient has a history of asthma.  She used her inhaler yesterday but not today.  No chest pain, vomiting, abdominal pain.  Past Medical History:  Diagnosis Date  . Asthma   . Cancer (HCC)    Uterine  . Hepatitis B   . Migraines   . Seizures [facility])     Patient Active Problem List   Diagnosis Date Noted  . Heart murmur 07/26/2014  . Epigastric abdominal pain 07/12/2014  . Tobacco abuse 07/12/2014    Past Surgical History:  Procedure Laterality Date  . CESAREAN SECTION    . CHOLECYSTECTOMY    . DILATION AND CURETTAGE OF UTERUS    . HERNIA REPAIR    . TUBAL LIGATION      Prior to Admission medications   Medication Sig Start Date End Date Taking? Authorizing Provider  guaiFENesin-codeine (ROBITUSSIN AC) 100-10 MG/5ML syrup Take 5 mLs by mouth 3 (three) times daily as needed for up to 2 days for cough. 02/24/20 02/26/20 Yes Enid Derry, PA-C  predniSONE (DELTASONE) 10 MG tablet Take 6 tablets on day 1, take 5 tablets on day 2, take 4 tablets on day 3, take 3 tablets on day 4, take 2 tablets on day 5, take 1 tablet on day 6 02/24/20  Yes Enid Derry, PA-C  cyclobenzaprine (FLEXERIL) 10 MG tablet Take 1 tablet (10 mg total) by mouth 3 (three) times daily as needed for muscle spasms. 01/12/20   Triplett, Rulon Eisenmenger B, FNP  ibuprofen (ADVIL) 800 MG tablet Take 1 tablet (800 mg total) by mouth every 8 (eight) hours as needed. 01/10/20   Menshew,  Charlesetta Ivory, PA-C  sucralfate (CARAFATE) 1 g tablet Take 1 tablet (1 g total) by mouth 4 (four) times daily. 10/12/18 11/11/18  Concha Se, MD  albuterol (PROVENTIL HFA;VENTOLIN HFA) 108 (90 Base) MCG/ACT inhaler Inhale 2 puffs into the lungs every 4 (four) hours as needed for wheezing or shortness of breath. 06/27/16 01/10/20  Chinita Pester, FNP    Allergies Strawberry extract  Family History  Problem Relation Age of Onset  . Cancer Mother     Social History Social History   Tobacco Use  . Smoking status: Current Every Day Smoker    Packs/day: 0.50    Types: Cigarettes  . Smokeless tobacco: Never Used  Substance Use Topics  . Alcohol use: Not Currently    Alcohol/week: 2.0 standard drinks    Types: 2 Cans of beer per week  . Drug use: No     Review of Systems  Constitutional: No fever/chills Eyes: No visual changes. No discharge. ENT: Negative for congestion and rhinorrhea. Positive for sore throat. Cardiovascular: No chest pain. Respiratory: Positive for cough and SOB. Gastrointestinal: No abdominal pain.  No nausea, no vomiting.  No diarrhea.  No constipation. Musculoskeletal: Negative for musculoskeletal pain. Skin: Negative for rash, abrasions, lacerations, ecchymosis. Neurological: Negative for headaches.   ____________________________________________   PHYSICAL EXAM:  VITAL SIGNS: ED Triage Vitals  Enc Vitals Group     BP 02/24/20 0956 (!) 142/94     Pulse Rate 02/24/20 0956 95     Resp 02/24/20 0956 16     Temp 02/24/20 0956 98.6 F (37 C)     Temp Source 02/24/20 0956 Oral     SpO2 02/24/20 0956 98 %     Weight --      Height --      Head Circumference --      Peak Flow --      Pain Score 02/24/20 0958 0     Pain Loc --      Pain Edu? --      Excl. in [place]? --      Constitutional: Alert and oriented. Well appearing and in no acute distress. Eyes: Conjunctivae are normal. PERRL. EOMI. No discharge. Head: Atraumatic. ENT: No frontal and  maxillary sinus tenderness.      Ears: Tympanic membranes pearly gray with good landmarks. No discharge.      Nose: No congestion/rhinnorhea.      Mouth/Throat: Mucous membranes are moist. Oropharynx non-erythematous. Tonsils not enlarged. No exudates. Uvula midline. Neck: No stridor.   Hematological/Lymphatic/Immunilogical: No cervical lymphadenopathy. Cardiovascular: Normal rate, regular rhythm.  Good peripheral circulation. Respiratory: Normal respiratory effort without tachypnea or retractions. Scattered wheezes. Good air entry to the bases with no decreased or absent breath sounds. Gastrointestinal: Bowel sounds 4 quadrants. Soft and nontender to palpation. No guarding or rigidity. No palpable masses. No distention. Musculoskeletal: Full range of motion to all extremities. No gross deformities appreciated. Neurologic:  Normal speech and language. No gross focal neurologic deficits are appreciated.  Skin:  Skin is warm, dry and intact. No rash noted. Psychiatric: Mood and affect are normal. Speech and behavior are normal. Patient exhibits appropriate insight and judgement.   ____________________________________________   LABS (all labs ordered are listed, but only abnormal results are displayed)  Labs Reviewed  RESP PANEL BY RT-PCR (FLU A&B, COVID) ARPGX2   ____________________________________________  EKG   ____________________________________________  RADIOLOGY Robinette Haines, personally viewed and evaluated these images (plain radiographs) as part of my medical decision making, as well as reviewing the written report by the radiologist.  DG Chest 2 View  Result Date: 02/24/2020 CLINICAL DATA:  Sore throat, cough and shortness of breath for 3 days. EXAM: CHEST - 2 VIEW COMPARISON:  06/29/2016 FINDINGS: Normal heart, mediastinum and hila. Lungs are hyperexpanded, but clear. No pleural effusion or pneumothorax. Skeletal structures are intact. IMPRESSION: 1. No acute  cardiopulmonary disease. 2. Hyperexpanded lungs. Electronically Signed   By: Lajean Manes M.D.   On: 02/24/2020 10:21    ____________________________________________    PROCEDURES  Procedure(s) performed:    Procedures    Medications  acetaminophen-codeine 120-12 MG/5ML solution 5 mL (5 mLs Oral Given 02/24/20 1134)  ipratropium-albuterol (DUONEB) 0.5-2.5 (3) MG/3ML nebulizer solution 3 mL (3 mLs Nebulization Given 02/24/20 1134)  predniSONE (DELTASONE) tablet 60 mg (60 mg Oral Given 02/24/20 1134)     ____________________________________________   INITIAL IMPRESSION / ASSESSMENT AND PLAN / ED COURSE  Pertinent labs & imaging results that were available during my care of the patient were reviewed by me and considered in my medical decision making (see chart for details).  Review of the [place] CSRS was performed in accordance of the [place] prior to dispensing any controlled drugs.     Patient presented to the emergency department for evaluation of nasal congestion and cough for 3 days with shortness of  breath today. Vital signs and exam are reassuring.  Chest x-ray negative for pneumonia.  Covid and influenza tests are negative.  Patient was given a dose of prednisone and Tylenol with codeine in the emergency department and a DuoNeb.  Patient feels significantly improved following the medications.  She has no longer short of breath.  Her wheezing has cleared to auscultation.  She does not feel that she needs another.  Patient feels comfortable going home. Patient will be discharged home with prescriptions for prednisone and Robitussin. Patient is to follow up with primary care as needed or otherwise directed. Patient is given ED precautions to return to the ED for any worsening or new symptoms.   Abigail Martinez was evaluated in Emergency Department on 02/24/2020 for the symptoms described in the history of present illness. She was evaluated in the context of the global COVID-19  pandemic, which necessitated consideration that the patient might be at risk for infection with the SARS-CoV-2 virus that causes COVID-19. Institutional protocols and algorithms that pertain to the evaluation of patients at risk for COVID-19 are in a state of rapid change based on information released by regulatory bodies including the CDC and federal and state organizations. These policies and algorithms were followed during the patient's care in the ED.  ____________________________________________  FINAL CLINICAL IMPRESSION(S) / ED DIAGNOSES  Final diagnoses:  Exacerbation of intermittent asthma, unspecified asthma severity      NEW MEDICATIONS STARTED DURING THIS VISIT:  ED Discharge Orders         Ordered    guaiFENesin-codeine (ROBITUSSIN AC) 100-10 MG/5ML syrup  3 times daily PRN        02/24/20 1229    predniSONE (DELTASONE) 10 MG tablet        02/24/20 1229              This chart was dictated using voice recognition software/Dragon. Despite best efforts to proofread, errors can occur which can change the meaning. Any change was purely unintentional.    Laban Emperor, PA-C 02/24/20 1700    Merlyn Lot, MD 02/26/20 1447

## 2021-05-22 ENCOUNTER — Other Ambulatory Visit: Payer: Self-pay

## 2021-05-22 ENCOUNTER — Emergency Department: Payer: Self-pay

## 2021-05-22 ENCOUNTER — Emergency Department
Admission: EM | Admit: 2021-05-22 | Discharge: 2021-05-22 | Disposition: A | Discharge status: Home / Self Care | Payer: Self-pay | Attending: Emergency Medicine | Admitting: Emergency Medicine

## 2021-05-22 DIAGNOSIS — Z8542 Personal history of malignant neoplasm of other parts of uterus: Secondary | ICD-10-CM | POA: Insufficient documentation

## 2021-05-22 DIAGNOSIS — S0181XA Laceration without foreign body of other part of head, initial encounter: Secondary | ICD-10-CM | POA: Insufficient documentation

## 2021-05-22 DIAGNOSIS — S0990XA Unspecified injury of head, initial encounter: Secondary | ICD-10-CM

## 2021-05-22 DIAGNOSIS — Y92009 Unspecified place in unspecified non-institutional (private) residence as the place of occurrence of the external cause: Secondary | ICD-10-CM | POA: Insufficient documentation

## 2021-05-22 DIAGNOSIS — Y908 Blood alcohol level of 240 mg/100 ml or more: Secondary | ICD-10-CM | POA: Insufficient documentation

## 2021-05-22 DIAGNOSIS — Y9301 Activity, walking, marching and hiking: Secondary | ICD-10-CM | POA: Insufficient documentation

## 2021-05-22 DIAGNOSIS — W01198A Fall on same level from slipping, tripping and stumbling with subsequent striking against other object, initial encounter: Secondary | ICD-10-CM | POA: Insufficient documentation

## 2021-05-22 DIAGNOSIS — W19XXXA Unspecified fall, initial encounter: Secondary | ICD-10-CM

## 2021-05-22 DIAGNOSIS — J45909 Unspecified asthma, uncomplicated: Secondary | ICD-10-CM | POA: Insufficient documentation

## 2021-05-22 DIAGNOSIS — F10920 Alcohol use, unspecified with intoxication, uncomplicated: Secondary | ICD-10-CM | POA: Insufficient documentation

## 2021-05-22 DIAGNOSIS — Z7951 Long term (current) use of inhaled steroids: Secondary | ICD-10-CM | POA: Insufficient documentation

## 2021-05-22 DIAGNOSIS — S01412A Laceration without foreign body of left cheek and temporomandibular area, initial encounter: Secondary | ICD-10-CM | POA: Insufficient documentation

## 2021-05-22 LAB — COMPREHENSIVE METABOLIC PANEL
ALT: 27 U/L (ref 0–44)
AST: 46 U/L — ABNORMAL HIGH (ref 15–41)
Albumin: 4.4 g/dL (ref 3.5–5.0)
Alkaline Phosphatase: 51 U/L (ref 38–126)
Anion gap: 11 (ref 5–15)
BUN: 8 mg/dL (ref 6–20)
CO2: 19 mmol/L — ABNORMAL LOW (ref 22–32)
Calcium: 8.7 mg/dL — ABNORMAL LOW (ref 8.9–10.3)
Chloride: 100 mmol/L (ref 98–111)
Creatinine, Ser: 0.6 mg/dL (ref 0.44–1.00)
GFR, Estimated: >60 mL/min (ref >60)
Glucose, Bld: 93 mg/dL (ref 70–99)
Potassium: 3.6 mmol/L (ref 3.5–5.1)
Sodium: 130 mmol/L — ABNORMAL LOW (ref 135–145)
Total Bilirubin: 0.5 mg/dL (ref 0.3–1.2)
Total Protein: 7.9 g/dL (ref 6.5–8.1)

## 2021-05-22 LAB — CBC
HCT: 38.6 % (ref 36.0–46.0)
Hemoglobin: 12.4 g/dL (ref 12.0–15.0)
MCH: 25.2 pg — ABNORMAL LOW (ref 26.0–34.0)
MCHC: 32.1 g/dL (ref 30.0–36.0)
MCV: 78.3 fL — ABNORMAL LOW (ref 80.0–100.0)
Platelets: 268 10*3/uL (ref 150–400)
RBC: 4.93 MIL/uL (ref 3.87–5.11)
RDW: 17.3 % — ABNORMAL HIGH (ref 11.5–15.5)
WBC: 11.8 10*3/uL — ABNORMAL HIGH (ref 4.0–10.5)
nRBC: 0 % (ref 0.0–0.2)

## 2021-05-22 LAB — ETHANOL: Alcohol, Ethyl (B): 354 mg/dL (ref <10)

## 2021-05-22 MED ORDER — OXYCODONE-ACETAMINOPHEN 5-325 MG PO TABS
1.0000 | ORAL_TABLET | Freq: Once | ORAL | Status: AC
  Administered 2021-05-22: 1 via ORAL
  Filled 2021-05-22: qty 1

## 2021-05-22 MED ORDER — BACITRACIN ZINC 500 UNIT/GM EX OINT
TOPICAL_OINTMENT | Freq: Once | CUTANEOUS | Status: AC
  Filled 2021-05-22: qty 0.9

## 2021-05-22 MED ORDER — LIDOCAINE HCL (PF) 1 % IJ SOLN
5.0000 mL | Freq: Once | INTRAMUSCULAR | Status: AC
  Administered 2021-05-22: 5 mL
  Filled 2021-05-22: qty 5

## 2021-05-22 MED ORDER — SODIUM CHLORIDE 0.9 % IV BOLUS
1000.0000 mL | Freq: Once | INTRAVENOUS | Status: AC
  Administered 2021-05-22: 1000 mL via INTRAVENOUS

## 2021-05-22 MED ORDER — CEPHALEXIN 500 MG PO CAPS: 500.0000 mg | ORAL_CAPSULE | Freq: Three times a day (TID) | ORAL | 0 refills | Status: DC

## 2021-05-22 MED ORDER — CEPHALEXIN 500 MG PO CAPS
500.0000 mg | ORAL_CAPSULE | Freq: Once | ORAL | Status: AC
  Administered 2021-05-22: 500 mg via ORAL
  Filled 2021-05-22: qty 1

## 2021-05-22 MED ORDER — NICOTINE 21 MG/24HR TD PT24
21.0000 mg | MEDICATED_PATCH | Freq: Once | TRANSDERMAL | Status: DC
  Administered 2021-05-22: 21 mg via TRANSDERMAL
  Filled 2021-05-22: qty 1

## 2021-05-22 NOTE — ED Provider Notes (Signed)
? ?[facility] ?Provider Note ? ? ? Event Date/Time  ? First MD Initiated Contact with Patient 05/22/21 0007   ?  (approximate) ? ? ?History  ? ?Fall and Facial Laceration ? ? ?HPI ? ?Abigail Martinez is a 46 y.o. female brought to the ED via EMS from home status post mechanical fall with facial injury.  Patient admits to having 10 beers tonight.  She was walking inside her home, slipped in the rain and fell face forward onto her brick stairs.  She was wearing glasses.  Denies LOC.  Denies anticoagulant use.  Tetanus is up-to-date.  Denies foreign body in eye.  Endorses facial pain secondary to laceration.  Denies vision changes, neck pain, chest pain, shortness of breath, abdominal pain, nausea, vomiting or dizziness. ?  ? ? ?Past Medical History  ? ?Past Medical History:  ?Diagnosis Date  ? Asthma   ? Cancer [facility])   ? Uterine  ? Hepatitis B   ? Migraines   ? Seizures ([place])   ? ? ? ?Active Problem List  ? ?Patient Active Problem List  ? Diagnosis Date Noted  ? Heart murmur 07/26/2014  ? Epigastric abdominal pain 07/12/2014  ? Tobacco abuse 07/12/2014  ? ? ? ?Past Surgical History  ? ?Past Surgical History:  ?Procedure Laterality Date  ? CESAREAN SECTION    ? CHOLECYSTECTOMY    ? DILATION AND CURETTAGE OF UTERUS    ? HERNIA REPAIR    ? TUBAL LIGATION    ? ? ? ?Home Medications  ? ?Prior to Admission medications   ?Medication Sig Start Date End Date Taking? Authorizing Provider  ?cephALEXin (KEFLEX) 500 MG capsule Take 1 capsule (500 mg total) by mouth 3 (three) times daily. 05/22/21  Yes Paulette Blanch, MD  ?cyclobenzaprine (FLEXERIL) 10 MG tablet Take 1 tablet (10 mg total) by mouth 3 (three) times daily as needed for muscle spasms. 01/12/20   Triplett, Johnette Abraham B, FNP  ?ibuprofen (ADVIL) 800 MG tablet Take 1 tablet (800 mg total) by mouth every 8 (eight) hours as needed. 01/10/20   Menshew, Dannielle Karvonen, PA-C  ?predniSONE (DELTASONE) 10 MG tablet Take 6 tablets on day 1, take 5 tablets on day 2,  take 4 tablets on day 3, take 3 tablets on day 4, take 2 tablets on day 5, take 1 tablet on day 6 02/24/20   Laban Emperor, PA-C  ?sucralfate (CARAFATE) 1 g tablet Take 1 tablet (1 g total) by mouth 4 (four) times daily. 10/12/18 11/11/18  Vanessa [place], MD  ?albuterol (PROVENTIL HFA;VENTOLIN HFA) 108 (90 Base) MCG/ACT inhaler Inhale 2 puffs into the lungs every 4 (four) hours as needed for wheezing or shortness of breath. 06/27/16 01/10/20  Victorino Dike, FNP  ? ? ? ?Allergies  ?Strawberry extract ? ? ?Family History  ? ?Family History  ?Problem Relation Age of Onset  ? Cancer Mother   ? ? ? ?Physical Exam  ?Triage Vital Signs: ?ED Triage Vitals  ?Enc Vitals Group  ?   BP   ?   Pulse   ?   Resp   ?   Temp   ?   Temp src   ?   SpO2   ?   Weight   ?   Height   ?   Head Circumference   ?   Peak Flow   ?   Pain Score   ?   Pain Loc   ?   Pain  Edu?   ?   Excl. in [place]?   ? ? ?Updated Vital Signs: ?BP (!) 152/89   Pulse 89   Temp 97.6 ?F (36.4 ?C) (Oral)   Resp 19   Ht '5\' 1"'$  (1.549 m)   Wt 50.8 kg   LMP 05/03/2021   SpO2 100%   BMI 21.16 kg/m?  ? ? ?General: Awake, mild distress.  ?CV:  Good peripheral perfusion.  ?Resp:  Normal effort.  ?Abd:  Nontender.  No distention.  ?Other:  Abrasions to forehead.  2 cm triangular-shaped laceration beneath left eye without active bleeding.  PERRL.  EOMI.  Atraumatic nose.  No dental malocclusion.  Cervical spine nontender to palpation.  Pelvis is stable.  No spinal tenderness to palpation. ? ? ?ED Results / Procedures / Treatments  ?Labs ?(all labs ordered are listed, but only abnormal results are displayed) ?Labs Reviewed  ?CBC - Abnormal; Notable for the following components:  ?    Result Value  ? WBC 11.8 (*)   ? MCV 78.3 (*)   ? MCH 25.2 (*)   ? RDW 17.3 (*)   ? All other components within normal limits  ?COMPREHENSIVE METABOLIC PANEL - Abnormal; Notable for the following components:  ? Sodium 130 (*)   ? CO2 19 (*)   ? Calcium 8.7 (*)   ? AST 46 (*)   ? All other  components within normal limits  ?ETHANOL - Abnormal; Notable for the following components:  ? Alcohol, Ethyl (B) 354 (*)   ? All other components within normal limits  ? ? ? ?EKG ? ?None ? ? ?RADIOLOGY ?I have independently visualized and reviewed patient's CT scans as well as noted the radiology interpretation: ? ?CT head: No ICH ? ?CT cervical spine: No acute fracture/dislocation ? ?CT maxillofacial: No acute fracture/dislocation ? ?Official radiology report(s): ?CT Head Wo Contrast ? ?Result Date: 05/22/2021 ?CLINICAL DATA:  Trauma. EXAM: CT HEAD WITHOUT CONTRAST CT MAXILLOFACIAL WITHOUT CONTRAST CT CERVICAL SPINE WITHOUT CONTRAST TECHNIQUE: Multidetector CT imaging of the head, cervical spine, and maxillofacial structures were performed using the standard protocol without intravenous contrast. Multiplanar CT image reconstructions of the cervical spine and maxillofacial structures were also generated. RADIATION DOSE REDUCTION: This exam was performed according to the departmental dose-optimization program which includes automated exposure control, adjustment of the mA and/or kV according to patient size and/or use of iterative reconstruction technique. COMPARISON:  Head CT dated 01/10/2020 and C-spine CT dated 01/12/2020. FINDINGS: CT HEAD FINDINGS Brain: The ventricles and sulci are appropriate size for the patient's age. The gray-white matter discrimination is preserved. There is no acute intracranial hemorrhage. No mass effect or midline shift. No extra-axial fluid collection. Vascular: No hyperdense vessel or unexpected calcification. Skull: Normal. Negative for fracture or focal lesion. Other: Lacerations over the left forehead and right supraorbital region. CT MAXILLOFACIAL FINDINGS Osseous: No acute fracture.  No mandibular dislocation. Orbits: Negative. No traumatic or inflammatory finding. Sinuses: There is mucoperiosteal thickening of the left maxillary sinus. The remainder of the visualized paranasal  sinuses and mastoid air cells are clear. Soft tissues: Laceration of the left forehead and right supraorbital region. CT CERVICAL SPINE FINDINGS Alignment: No acute subluxation. There is straightening of normal cervical lordosis which may be positional or due to muscle spasm. Skull base and vertebrae: No acute fracture. Soft tissues and spinal canal: No prevertebral fluid or swelling. No visible canal hematoma. Disc levels:  No acute findings.  Mild degenerative changes. Upper chest: Emphysema. Other: None IMPRESSION:  1. No acute intracranial abnormality. 2. No acute/traumatic cervical spine pathology. 3. No acute facial bone fractures. Electronically Signed   By: Anner Crete M.D.   On: 05/22/2021 00:45  ? ?CT Cervical Spine Wo Contrast ? ?Result Date: 05/22/2021 ?CLINICAL DATA:  Trauma. EXAM: CT HEAD WITHOUT CONTRAST CT MAXILLOFACIAL WITHOUT CONTRAST CT CERVICAL SPINE WITHOUT CONTRAST TECHNIQUE: Multidetector CT imaging of the head, cervical spine, and maxillofacial structures were performed using the standard protocol without intravenous contrast. Multiplanar CT image reconstructions of the cervical spine and maxillofacial structures were also generated. RADIATION DOSE REDUCTION: This exam was performed according to the departmental dose-optimization program which includes automated exposure control, adjustment of the mA and/or kV according to patient size and/or use of iterative reconstruction technique. COMPARISON:  Head CT dated 01/10/2020 and C-spine CT dated 01/12/2020. FINDINGS: CT HEAD FINDINGS Brain: The ventricles and sulci are appropriate size for the patient's age. The gray-white matter discrimination is preserved. There is no acute intracranial hemorrhage. No mass effect or midline shift. No extra-axial fluid collection. Vascular: No hyperdense vessel or unexpected calcification. Skull: Normal. Negative for fracture or focal lesion. Other: Lacerations over the left forehead and right supraorbital  region. CT MAXILLOFACIAL FINDINGS Osseous: No acute fracture.  No mandibular dislocation. Orbits: Negative. No traumatic or inflammatory finding. Sinuses: There is mucoperiosteal thickening of the left maxillary sinus. T

## 2021-05-22 NOTE — ED Notes (Signed)
Md in room stitching patients face now. ?

## 2021-05-22 NOTE — ED Triage Notes (Signed)
Pt presents to ER via ems from home.  Pt was walking inside tonight, and slipped, and fell face forward into her brick stairs.  Pt has various  lacerations noted to left side of face with bleeding controlled at this time.  Bleeding controlled.  Pt denies LOC, denies blood thinners.  Pt does endorse having appx 10 beers tonight.  Pt is A&O x4 at this time.   ?

## 2021-05-22 NOTE — Discharge Instructions (Addendum)
1.  Suture removal in 5 days. ?2.  Take antibiotic as prescribed (Keflex 500 mg 3 times daily x7 days). ?3.  Return to the ER for worsening symptoms, increased redness/swelling, purulent discharge or other concerns. ?

## 2021-05-22 NOTE — ED Notes (Signed)
Pt transported to CT at this time.

## 2021-07-24 ENCOUNTER — Emergency Department
Admission: EM | Admit: 2021-07-24 | Discharge: 2021-07-24 | Disposition: A | Discharge status: Home / Self Care | Payer: Self-pay | Attending: Emergency Medicine | Admitting: Emergency Medicine

## 2021-07-24 ENCOUNTER — Encounter: Payer: Self-pay | Admitting: Medical Oncology

## 2021-07-24 ENCOUNTER — Emergency Department: Payer: Self-pay

## 2021-07-24 DIAGNOSIS — Z87891 Personal history of nicotine dependence: Secondary | ICD-10-CM | POA: Insufficient documentation

## 2021-07-24 DIAGNOSIS — R052 Subacute cough: Secondary | ICD-10-CM

## 2021-07-24 DIAGNOSIS — J069 Acute upper respiratory infection, unspecified: Secondary | ICD-10-CM | POA: Insufficient documentation

## 2021-07-24 DIAGNOSIS — Z20822 Contact with and (suspected) exposure to covid-19: Secondary | ICD-10-CM | POA: Insufficient documentation

## 2021-07-24 LAB — RESP PANEL BY RT-PCR (FLU A&B, COVID) ARPGX2
Influenza A by PCR: NEGATIVE
Influenza B by PCR: NEGATIVE
SARS Coronavirus 2 by RT PCR: NEGATIVE

## 2021-07-24 MED ORDER — PREDNISONE 10 MG PO TABS: 40.0000 mg | ORAL_TABLET | Freq: Every day | ORAL | 0 refills | Status: AC

## 2021-07-24 MED ORDER — ALBUTEROL SULFATE HFA 108 (90 BASE) MCG/ACT IN AERS: 2.0000 | INHALATION_SPRAY | Freq: Four times a day (QID) | RESPIRATORY_TRACT | 2 refills | Status: AC | PRN

## 2021-07-24 MED ORDER — IPRATROPIUM-ALBUTEROL 0.5-2.5 (3) MG/3ML IN SOLN
3.0000 mL | Freq: Once | RESPIRATORY_TRACT | Status: AC
  Administered 2021-07-24: 3 mL via RESPIRATORY_TRACT
  Filled 2021-07-24: qty 3

## 2021-07-24 NOTE — ED Notes (Signed)
Patient transported to X-ray 

## 2021-07-24 NOTE — ED Provider Notes (Signed)
[facility] Provider Note    Event Date/Time   First MD Initiated Contact with Patient 07/24/21 [phone]     (approximate)   History   Cough   HPI  Abigail Martinez is a 46 y.o. female who presents today for evaluation of cough 2 to 3 weeks.  Patient reports that her symptoms began the day before Mother's Day.  She reports that she has had a dry cough that she describes as having coughing fits.  She has also had nasal congestion and runny nose.  She has been trying over-the-counter allergy medications without improvement of her symptoms.  She reports that her mother-in-law was sick with the same symptoms.  Her mother-in-law got a Z-Pak which was helpful for her.  Patient denies chest pain, back pain, abdominal pain, nausea, vomiting, headache, neck pain, body aches.  Denies lower extremity swelling or hemoptysis.  Patient Active Problem List   Diagnosis Date Noted   Heart murmur 07/26/2014   Epigastric abdominal pain 07/12/2014   Tobacco abuse 07/12/2014          Physical Exam   Triage Vital Signs: ED Triage Vitals  Enc Vitals Group     BP 07/24/21 0855 (!) 146/102     Pulse Rate 07/24/21 0855 98     Resp 07/24/21 0855 19     Temp 07/24/21 0855 97.8 F (36.6 C)     Temp Source 07/24/21 0855 Oral     SpO2 07/24/21 0855 96 %     Weight 07/24/21 0856 117 lb (53.1 kg)     Height 07/24/21 0856 '5\' 1"'$  (1.549 m)     Head Circumference --      Peak Flow --      Pain Score 07/24/21 0855 0     Pain Loc --      Pain Edu? --      Excl. in [place]? --     Most recent vital signs: Vitals:   07/24/21 0855 07/24/21 1341  BP: (!) 146/102 (!) 149/94  Pulse: 98 69  Resp: 19 18  Temp: 97.8 F (36.6 C) 98.3 F (36.8 C)  SpO2: 96% 98%    Physical Exam Vitals and nursing note reviewed.  Constitutional:      General: Awake and alert. No acute distress.    Appearance: Normal appearance. she is well-developed and normal weight.  HENT:     Head: Normocephalic  and atraumatic.     Mouth/Throat:     Mouth: Mucous membranes are moist.  Eyes:     General: PERRL. Normal EOMs        Right eye: No discharge.        Left eye: No discharge.     Conjunctiva/sclera: Conjunctivae normal.  Cardiovascular:     Rate and Rhythm: Normal rate and regular rhythm.     Pulses: Normal pulses.     Heart sounds: Normal heart sounds Pulmonary:     Effort: Pulmonary effort is normal. No respiratory distress.  Able to speak easily in complete sentences, dry cough on exam    Breath sounds: Faint expiratory wheezes bilaterally Abdominal:     Abdomen is soft. There is no abdominal tenderness. No rebound or guarding. No distention. Musculoskeletal:        General: No swelling. Normal range of motion.  No lower extremity swelling, no calf tenderness    Cervical back: Normal range of motion and neck supple.  Lymphadenopathy:     Cervical: No cervical adenopathy.  Skin:    General: Skin is warm and dry.     Capillary Refill: Capillary refill takes less than 2 seconds.     Findings: No rash.  Neurological:     Mental Status: she is alert.      ED Results / Procedures / Treatments   Labs (all labs ordered are listed, but only abnormal results are displayed) Labs Reviewed  RESP PANEL BY RT-PCR (FLU A&B, COVID) ARPGX2     EKG     RADIOLOGY I independently reviewed imaging and agree with radiologist findings    PROCEDURES:  Critical Care performed:   Procedures   MEDICATIONS ORDERED IN ED: Medications  ipratropium-albuterol (DUONEB) 0.5-2.5 (3) MG/3ML nebulizer solution 3 mL (3 mLs Nebulization Given 07/24/21 1001)     IMPRESSION / MDM / ASSESSMENT AND PLAN / ED COURSE  I reviewed the triage vital signs and the nursing notes.   Differential diagnosis includes, but is not limited to, bronchitis, bronchospasm, pneumonia, URI, asthma/COPD.  Patient is awake and alert, hemodynamically stable and afebrile.  She has normal oxygen saturation on room  air and demonstrates no increased work of breathing.  She has intermittent dry coughing spells on exam, though is easily able to speak in complete sentences.  Lungs demonstrate very faint expiratory wheezes bilaterally.  With her stuffy/runny nose, possible component of URI that has elicited bronchospasms.  Given how long her symptoms have been ongoing, chest x-ray was obtained.  She was treated with a DuoNeb to help reverse her bronchospasm.  COVID/flu/RSV swab also obtained and is negative.  Upon reevaluation, patient reports that she feels significantly improved, however she is requesting a short course of prednisone.  This is appropriate given her history of asthma/COPD.  She also requested a refill of her inhaler which was provided.  No chest pain, hemoptysis, tachycardia, hypoxia, exogenous hormone use, leg swelling/clinical signs or symptoms of DVT, personal or family history of PE/DVT to suggest PE as a source of her symptoms today.  We again discussed at length the importance of smoking cessation.  We discussed return precautions and the importance of close outpatient follow-up.  Patient understands and agrees with plan.  She was discharged in stable condition.   Patient's presentation is most consistent with exacerbation of chronic illness.      FINAL CLINICAL IMPRESSION(S) / ED DIAGNOSES   Final diagnoses:  Subacute cough  Upper respiratory tract infection, unspecified type     Rx / DC Orders   ED Discharge Orders          Ordered    predniSONE (DELTASONE) 10 MG tablet  Daily        07/24/21 1126    albuterol (VENTOLIN HFA) 108 (90 Base) MCG/ACT inhaler  Every 6 hours PRN        07/24/21 1126             Note:  This document was prepared using Dragon voice recognition software and may include unintentional dictation errors.   Emeline Gins 07/24/21 1544    Carrie Mew, MD 07/24/21 1547

## 2021-07-24 NOTE — Discharge Instructions (Signed)
Your chest x-ray was normal.  Your COVID/flu/RSV test is negative.  A prednisone burst and a refill of your albuterol inhaler was sent to your pharmacy.  Please return the emergency department for any chest pain, worsening shortness of breath, fevers, chills, dizziness or palpitations, or any other concerns.  It was a pleasure caring for you today.

## 2021-07-24 NOTE — ED Notes (Signed)
Reports since Mothers Day weekend she has had cough that is now worsening and reports feeling congested in throat and face. No relief with inhaler at home per patient

## 2021-07-24 NOTE — ED Triage Notes (Signed)
Pt reports hx of bronchitis and since mothers day has been having cough that has worsened. Pt denies fever.

## 2021-09-04 ENCOUNTER — Other Ambulatory Visit: Payer: Self-pay

## 2021-09-04 ENCOUNTER — Emergency Department
Admission: EM | Admit: 2021-09-04 | Discharge: 2021-09-04 | Disposition: A | Discharge status: Home / Self Care | Payer: Self-pay | Attending: Emergency Medicine | Admitting: Emergency Medicine

## 2021-09-04 DIAGNOSIS — A499 Bacterial infection, unspecified: Secondary | ICD-10-CM | POA: Insufficient documentation

## 2021-09-04 DIAGNOSIS — B9689 Other specified bacterial agents as the cause of diseases classified elsewhere: Secondary | ICD-10-CM

## 2021-09-04 LAB — BASIC METABOLIC PANEL
Anion gap: 11 (ref 5–15)
BUN: 7 mg/dL (ref 6–20)
CO2: 24 mmol/L (ref 22–32)
Calcium: 8.9 mg/dL (ref 8.9–10.3)
Chloride: 106 mmol/L (ref 98–111)
Creatinine, Ser: 0.63 mg/dL (ref 0.44–1.00)
GFR, Estimated: >60 mL/min (ref >60)
Glucose, Bld: 85 mg/dL (ref 70–99)
Potassium: 3.8 mmol/L (ref 3.5–5.1)
Sodium: 141 mmol/L (ref 135–145)

## 2021-09-04 LAB — CBC WITH DIFFERENTIAL/PLATELET
Abs Immature Granulocytes: 0.02 10*3/uL (ref 0.00–0.07)
Basophils Absolute: 0.1 10*3/uL (ref 0.0–0.1)
Basophils Relative: 1 %
Eosinophils Absolute: 0.1 10*3/uL (ref 0.0–0.5)
Eosinophils Relative: 1 %
HCT: 36 % (ref 36.0–46.0)
Hemoglobin: 11.5 g/dL — ABNORMAL LOW (ref 12.0–15.0)
Immature Granulocytes: 0 %
Lymphocytes Relative: 21 %
Lymphs Abs: 1.4 10*3/uL (ref 0.7–4.0)
MCH: 25.9 pg — ABNORMAL LOW (ref 26.0–34.0)
MCHC: 31.9 g/dL (ref 30.0–36.0)
MCV: 81.1 fL (ref 80.0–100.0)
Monocytes Absolute: 0.6 10*3/uL (ref 0.1–1.0)
Monocytes Relative: 8 %
Neutro Abs: 4.8 10*3/uL (ref 1.7–7.7)
Neutrophils Relative %: 69 %
Platelets: 283 10*3/uL (ref 150–400)
RBC: 4.44 MIL/uL (ref 3.87–5.11)
RDW: 16.8 % — ABNORMAL HIGH (ref 11.5–15.5)
WBC: 6.9 10*3/uL (ref 4.0–10.5)
nRBC: 0 % (ref 0.0–0.2)

## 2021-09-04 MED ORDER — CLINDAMYCIN HCL 300 MG PO CAPS: 300.0000 mg | ORAL_CAPSULE | Freq: Three times a day (TID) | ORAL | 0 refills | Status: AC

## 2021-09-04 NOTE — ED Notes (Signed)
Visual Acuity- pt usually wears glasses, however pt glasses were broken in a fall.  Right 20/50 Left 20/70 Both 20/50

## 2021-09-04 NOTE — Discharge Instructions (Signed)
Follow-up with Dr. Wallace Going if you continue to have problems with your left eye.  You may apply warm compresses to your face as needed for discomfort but do not pick at the pustules that are on your face as this may cause increased infection.  Begin taking the antibiotic as directed for the next 10 days.  Also a list of clinics was listed on your discharge papers so that you can establish a primary care provider.  These include [facility] and [facility],  [facility] and [facility] which does not charge a set fee but is based on your income.

## 2021-09-04 NOTE — ED Triage Notes (Addendum)
Pt c/o pain to the left eye with redness for the past 4 days, denies injury, denies drainage.

## 2021-09-04 NOTE — ED Provider Notes (Signed)
[facility] Provider Note    Event Date/Time   First MD Initiated Contact with Patient 09/04/21 [phone]     (approximate)   History   Eye Pain   HPI  Abigail Martinez is a 46 y.o. female   presents to the ED with complaint of left eye redness for the past 4 days without history of injury.  Patient denies any discharge or visual changes.  Patient was seen in the emergency department on 05/22/2021 after a fall in which she had a laceration to the left side of her face when her glasses broke.  The laceration to her face required sutures.  In looking through her records patient had a CT head, facial bones and cervical spine.  No foreign body was noted.  Patient has a history of cancer, hepatitis B, asthma, migraines and seizures.      Physical Exam   Triage Vital Signs: ED Triage Vitals [09/04/21 0914]  Enc Vitals Group     BP (!) 148/94     Pulse Rate 85     Resp 15     Temp 97.9 F (36.6 C)     Temp Source Oral     SpO2 98 %     Weight 117 lb (53.1 kg)     Height '5\' 1"'$  (1.549 m)     Head Circumference      Peak Flow      Pain Score 7     Pain Loc      Pain Edu?      Excl. in [place]?     Most recent vital signs: Vitals:   09/04/21 0914 09/04/21 1208  BP: (!) 148/94 139/89  Pulse: 85 85  Resp: 15 18  Temp: 97.9 F (36.6 C)   SpO2: 98% 98%     General: Awake, no distress.  CV:  Good peripheral perfusion.  Resp:  Normal effort.  Abd:  No distention.  Other:  PERRLA, EOMI's, conjunctive are clear, nontender palpation periorbital area.  There is scar tissue on the left cheek with multiple pustules present.  No soft tissue edema or localized fluctuance is noted on palpation.  Patient is afebrile.   ED Results / Procedures / Treatments   Labs (all labs ordered are listed, but only abnormal results are displayed) Labs Reviewed  CBC WITH DIFFERENTIAL/PLATELET - Abnormal; Notable for the following components:      Result Value   Hemoglobin 11.5  (*)    MCH 25.9 (*)    RDW 16.8 (*)    All other components within normal limits  BASIC METABOLIC PANEL      PROCEDURES:  Critical Care performed:   Procedures   MEDICATIONS ORDERED IN ED: Medications - No data to display   IMPRESSION / MDM / [place] / ED COURSE  I reviewed the triage vital signs and the nursing notes.   Differential diagnosis includes, but is not limited to, skin infection, facial trauma.  46 year old female presents to the ED with complaint of possible infection to her left cheek.  See HPI for extended history.  Past medical records were reviewed.  CBC was reassuring with WBC 6.9 and BMP was normal.  I discussed with patient that the pustules should remain closed but she could use warm moist compresses to the area.  At this time there is no cellulitis but she is to return immediately should she began running fever, chills, increased redness or drainage.  Patient was started on  clindamycin 300 mg 3 times daily and may take over-the-counter medication as needed for pain.      Patient's presentation is most consistent with acute complicated illness / injury requiring diagnostic workup.  FINAL CLINICAL IMPRESSION(S) / ED DIAGNOSES   Final diagnoses:  Bacterial skin infection     Rx / DC Orders   ED Discharge Orders          Ordered    clindamycin (CLEOCIN) 300 MG capsule  3 times daily        09/04/21 1155             Note:  This document was prepared using Dragon voice recognition software and may include unintentional dictation errors.   Johnn Hai, PA-C 09/04/21 1533    Nance Pear, MD 09/04/21 1535

## 2022-04-03 ENCOUNTER — Other Ambulatory Visit: Payer: Self-pay

## 2022-04-03 ENCOUNTER — Emergency Department
Admission: EM | Admit: 2022-04-03 | Discharge: 2022-04-03 | Disposition: A | Discharge status: Home / Self Care | Payer: Self-pay | Attending: Emergency Medicine | Admitting: Emergency Medicine

## 2022-04-03 DIAGNOSIS — J111 Influenza due to unidentified influenza virus with other respiratory manifestations: Secondary | ICD-10-CM

## 2022-04-03 DIAGNOSIS — J45909 Unspecified asthma, uncomplicated: Secondary | ICD-10-CM | POA: Insufficient documentation

## 2022-04-03 DIAGNOSIS — Z1152 Encounter for screening for COVID-19: Secondary | ICD-10-CM | POA: Insufficient documentation

## 2022-04-03 DIAGNOSIS — J101 Influenza due to other identified influenza virus with other respiratory manifestations: Secondary | ICD-10-CM | POA: Insufficient documentation

## 2022-04-03 LAB — RESP PANEL BY RT-PCR (RSV, FLU A&B, COVID)  RVPGX2
Influenza A by PCR: POSITIVE — AB
Influenza B by PCR: NEGATIVE
Resp Syncytial Virus by PCR: NEGATIVE
SARS Coronavirus 2 by RT PCR: NEGATIVE

## 2022-04-03 MED ORDER — OSELTAMIVIR PHOSPHATE 75 MG PO CAPS: 75.0000 mg | ORAL_CAPSULE | Freq: Two times a day (BID) | ORAL | 0 refills | Status: AC

## 2022-04-03 NOTE — ED Triage Notes (Signed)
Pt with generalized body aches, N/V/D x 2 days. Pt states she has not been able to eat or drink well and has a cough. Pt states she has taken gabapentin but that has not helped.

## 2022-04-03 NOTE — Discharge Instructions (Addendum)
Take acetaminophen 650 mg and ibuprofen 400 mg every 6 hours for pain.  Take with food. Take albuterol inhaler as needed for wheezing.   Thank you for choosing [place] for your health care today!  Please see your primary doctor this week for a follow up appointment.   Sometimes, in the early stages of certain disease courses it is difficult to detect in the emergency department evaluation -- so, it is important that you continue to monitor your symptoms and call your doctor right away or return to the emergency department if you develop any new or worsening symptoms.  Please go to the following website to schedule new (and existing) patient appointments:   [email or web address]  If you do not have a primary doctor try calling the following clinics to establish care:  If you have insurance:  [facility] [phone] [place] [address]   [facility]  [phone] [place] [place] [address]   If you do not have insurance:  [facility]  [phone] [address], [place] [address]   The following is another list of primary care offices in the area who are accepting new patients at this time.  Please reach out to one of them directly and let them know you would like to schedule an appointment to follow up on an Emergency Department visit, and/or to establish a new primary care provider (PCP).  There are likely other primary care clinics in the are who are accepting new patients, but this is an excellent place to start:  [place] physician: Dr Lavon Paganini [address] [place], [place] [address] [phone]  [facility] Lead Physician: Dr Steele Sizer [address], [place], [place] [address] [phone]  [place] Physician: Dr Park Liter [address] [place], [place] [address] [phone]  [facility] Lead Physician: Dr Dewaine Oats [place], [place], [place] [address] [phone]  [facility] Physician: Dr Halina Maidens [address] [place],  [address] [phone]   It was my pleasure to care for you today.   Hoover Brunette Jacelyn Grip, MD

## 2022-04-03 NOTE — ED Provider Notes (Signed)
[facility] Provider Note    Event Date/Time   First MD Initiated Contact with Patient 04/03/22 1216     (approximate)   History   Generalized Body Aches   HPI  Abigail Martinez is a 47 y.o. female   Past medical history of no significant past medical history who presents with 2 days of cough, nasal congestion, myalgias, generalized weakness and fatigue.  She works as a Furniture conservator/restorer at [employer].  She has a history of asthma but has no shortness of breath and has not been using her albuterol inhaler.  He denies chest pain, abdominal pain or GU symptoms.  No other acute medical complaints.  External Medical Documents Reviewed: Emergency department visit dated July 2023 for eye discomfort      Physical Exam   Triage Vital Signs: ED Triage Vitals  Enc Vitals Group     BP 04/03/22 1150 (!) 145/108     Pulse Rate 04/03/22 1150 (!) 104     Resp 04/03/22 1150 16     Temp 04/03/22 1150 99.3 F (37.4 C)     Temp Source 04/03/22 1150 Oral     SpO2 04/03/22 1150 97 %     Weight 04/03/22 1150 117 lb (53.1 kg)     Height 04/03/22 1150 '5\' 1"'$  (1.549 m)     Head Circumference --      Peak Flow --      Pain Score 04/03/22 1156 10     Pain Loc --      Pain Edu? --      Excl. in [place]? --     Most recent vital signs: Vitals:   04/03/22 1150  BP: (!) 145/108  Pulse: (!) 104  Resp: 16  Temp: 99.3 F (37.4 C)  SpO2: 97%    General: Awake, no distress.  CV:  Good peripheral perfusion.  Resp:  Normal effort.  Abd:  No distention.  Other:  Wake alert oriented nontoxic-appearing comfortable, mild tachycardia 100, otherwise vital signs within normal limits, mild hypertension 140/100.  Neck supple with full range of motion, lungs with a very scant wheeze no focality, abdomen soft and nontender skin appears warm well-perfused   ED Results / Procedures / Treatments   Labs (all labs ordered are listed, but only abnormal results are displayed) Labs  Reviewed  RESP PANEL BY RT-PCR (RSV, FLU A&B, COVID)  RVPGX2 - Abnormal; Notable for the following components:      Result Value   Influenza A by PCR POSITIVE (*)    All other components within normal limits     I ordered and reviewed the above labs they are notable for positive FLU on viral swabs    PROCEDURES:  Critical Care performed: No  Procedures   MEDICATIONS ORDERED IN ED: Medications - No data to display  External physician / consultants:  IMPRESSION / MDM / [place] / ED COURSE  I reviewed the triage vital signs and the nursing notes.                                Patient's presentation is most consistent with acute presentation with potential threat to life or bodily function.  Differential diagnosis includes, but is not limited to, viral URI, bacterial pneumonia, sepsis, meningitis, deep space neck infection or abscess   The patient is on the cardiac monitor to evaluate for evidence of arrhythmia  and/or significant heart rate changes.  MDM: This is a patient with viral URI who appears well doubt emergent pathologies, check viral swab and give antiviral as indicated as patient would like to trial understanding there are potential side effects as well.  She has a scant wheeze but no shortness of breath and I advise she continue to use her albuterol inhaler at home as needed for shortness of breath or wheezing.  She understands to return to the emergency department with any new or worsening symptoms.  I considered hospitalization for admission or observation given overall well appearance and symptoms consistent with viral URI, I think outpatient follow-up and monitoring is most appropriate at this time patient is in agreement.  He is flu positive we will get Tamiflu.        FINAL CLINICAL IMPRESSION(S) / ED DIAGNOSES   Final diagnoses:  Influenza     Rx / DC Orders   ED Discharge Orders          Ordered    oseltamivir (TAMIFLU) 75 MG  capsule  2 times daily        04/03/22 1317             Note:  This document was prepared using Dragon voice recognition software and may include unintentional dictation errors.    Lucillie Garfinkel, MD 04/03/22 [phone]

## 2023-01-17 ENCOUNTER — Emergency Department
Admission: EM | Admit: 2023-01-17 | Discharge: 2023-01-17 | Disposition: A | Discharge status: Home / Self Care | Payer: Self-pay | Attending: Student in an Organized Health Care Education/Training Program | Admitting: Student in an Organized Health Care Education/Training Program

## 2023-01-17 ENCOUNTER — Emergency Department: Payer: Self-pay

## 2023-01-17 ENCOUNTER — Other Ambulatory Visit: Payer: Self-pay

## 2023-01-17 DIAGNOSIS — W010XXA Fall on same level from slipping, tripping and stumbling without subsequent striking against object, initial encounter: Secondary | ICD-10-CM | POA: Diagnosis not present

## 2023-01-17 DIAGNOSIS — S322XXA Fracture of coccyx, initial encounter for closed fracture: Secondary | ICD-10-CM | POA: Diagnosis not present

## 2023-01-17 DIAGNOSIS — M533 Sacrococcygeal disorders, not elsewhere classified: Secondary | ICD-10-CM | POA: Diagnosis present

## 2023-01-17 DIAGNOSIS — S3210XA Unspecified fracture of sacrum, initial encounter for closed fracture: Secondary | ICD-10-CM | POA: Insufficient documentation

## 2023-01-17 MED ORDER — LIDOCAINE 5 % EX PTCH
1.0000 | MEDICATED_PATCH | CUTANEOUS | Status: DC
  Administered 2023-01-17: 1 via TRANSDERMAL
  Filled 2023-01-17: qty 1

## 2023-01-17 MED ORDER — NAPROXEN 500 MG PO TABS: 500.0000 mg | ORAL_TABLET | Freq: Two times a day (BID) | ORAL | 0 refills | Status: AC

## 2023-01-17 MED ORDER — ACETAMINOPHEN 325 MG PO TABS
650.0000 mg | ORAL_TABLET | Freq: Once | ORAL | Status: AC
  Administered 2023-01-17: 650 mg via ORAL
  Filled 2023-01-17: qty 2

## 2023-01-17 NOTE — Discharge Instructions (Addendum)
Please sit on doughnut pillows to avoid direct pressure on your coccyx/tailbone area.  Please return for any new, worsening, or change in symptoms or other concerns.  It was a pleasure caring for you today.

## 2023-01-17 NOTE — ED Triage Notes (Signed)
Pt c/o lower back/coccyx pain following a fall 2 nights ago. Rates pain 10/10, sitting makes pain worse.

## 2023-01-17 NOTE — ED Provider Notes (Signed)
[facility] Provider Note    Event Date/Time   First MD Initiated Contact with Patient 01/17/23 1143     (approximate)   History   Fall   HPI  Abigail Martinez is a 47 y.o. female who presents today for evaluation of tailbone pain after a slip and fall 2 days ago.  Patient reports that she slipped on the ground and landed directly on her buttocks onto concrete steps.  There was no head strike or LOC.  She has had pain with sitting ever since.  She has not had any trouble urinating or moving her bowels.  No abdominal pain.  No pain further up in her back.  No numbness or tingling in her legs.  She is able to ambulate.  Patient Active Problem List   Diagnosis Date Noted   Heart murmur 07/26/2014   Epigastric abdominal pain 07/12/2014   Tobacco abuse 07/12/2014          Physical Exam   Triage Vital Signs: ED Triage Vitals  Encounter Vitals Group     BP 01/17/23 1129 (!) 135/106     Systolic BP Percentile --      Diastolic BP Percentile --      Pulse Rate 01/17/23 1129 94     Resp 01/17/23 1129 20     Temp 01/17/23 1129 98 F (36.7 C)     Temp Source 01/17/23 1129 Oral     SpO2 --      Weight 01/17/23 1130 110 lb (49.9 kg)     Height 01/17/23 1130 5\' 1"  (1.549 m)     Head Circumference --      Peak Flow --      Pain Score 01/17/23 1130 10     Pain Loc --      Pain Education --      Exclude from Growth Chart --     Most recent vital signs: Vitals:   01/17/23 1129  BP: (!) 135/106  Pulse: 94  Resp: 20  Temp: 98 F (36.7 C)    Physical Exam Vitals and nursing note reviewed.  Constitutional:      General: Awake and alert. No acute distress.    Appearance: Normal appearance. The patient is normal weight.  HENT:     Head: Normocephalic and atraumatic.     Mouth: Mucous membranes are moist.  Eyes:     General: PERRL. Normal EOMs        Right eye: No discharge.        Left eye: No discharge.     Conjunctiva/sclera: Conjunctivae  normal.  Cardiovascular:     Rate and Rhythm: Normal rate and regular rhythm.     Pulses: Normal pulses.  Pulmonary:     Effort: Pulmonary effort is normal. No respiratory distress.     Breath sounds: Normal breath sounds.  Abdominal:     Abdomen is soft. There is no abdominal tenderness. No rebound or guarding. No distention. Musculoskeletal:        General: No swelling. Normal range of motion.     Cervical back: Normal range of motion and neck supple.  Back: No midline tenderness.  Tenderness to sacrum without overlying skin changes.  Strength and sensation 5/5 to bilateral lower extremities. Normal great toe extension against resistance. Normal sensation throughout feet. Normal patellar reflexes. Negative SLR and opposite SLR bilaterally. Skin:    General: Skin is warm and dry.     Capillary Refill: Capillary refill  takes less than 2 seconds.     Findings: No rash.  Neurological:     Mental Status: The patient is awake and alert.      ED Results / Procedures / Treatments   Labs (all labs ordered are listed, but only abnormal results are displayed) Labs Reviewed - No data to display   EKG     RADIOLOGY I independently reviewed and interpreted imaging and agree with radiologists findings.     PROCEDURES:  Critical Care performed:   Procedures   MEDICATIONS ORDERED IN ED: Medications  lidocaine (LIDODERM) 5 % 1 patch (1 patch Transdermal Patch Applied 01/17/23 1224)  acetaminophen (TYLENOL) tablet 650 mg (650 mg Oral Given 01/17/23 1223)     IMPRESSION / MDM / ASSESSMENT AND PLAN / ED COURSE  I reviewed the triage vital signs and the nursing notes.   Differential diagnosis includes, but is not limited to, coxalgia, sacral/coccyx contusion, sacral/coccyx fracture.  Patient is awake and alert, hemodynamically stable and afebrile.  She is nontoxic in appearance.  No tenderness to her L-spine, T-spine, or C-spine.  She has pain to her tailbone area though there  is no overlying skin changes.  She has normal strength and sensation bilateral lower extremities.  She is able to move her bowels normally, has pain with sitting only.  X-ray was obtained and is suspicious for a possible coccyx fracture.  Recommended sitting on donut pillows, we also discussed symptomatic management as well as return precautions.  Patient understands and agrees with plan.  She was discharged in stable condition.   Patient's presentation is most consistent with acute complicated illness / injury requiring diagnostic workup.       FINAL CLINICAL IMPRESSION(S) / ED DIAGNOSES   Final diagnoses:  Closed fracture of sacrum and coccyx, initial encounter (HCC)     Rx / DC Orders   ED Discharge Orders          Ordered    naproxen (NAPROSYN) 500 MG tablet  2 times daily with meals        01/17/23 1237             Note:  This document was prepared using Dragon voice recognition software and may include unintentional dictation errors.   Keturah Shavers 01/17/23 1448    Willy Eddy, MD 01/17/23 [phone]

## 2023-01-17 NOTE — ED Notes (Signed)
Patient transported to X-ray
